# Patient Record
Sex: Male | Born: 1990
Health system: Southern US, Community
[De-identification: ages and names within clinical notes are randomized; demographics above are authoritative.]

## PROBLEM LIST (undated history)

## (undated) DIAGNOSIS — Z789 Other specified health status: Secondary | ICD-10-CM

## (undated) HISTORY — PX: CHEST SURGERY: SHX595

---
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
## 2011-06-30 ENCOUNTER — Emergency Department: Payer: Self-pay | Admitting: Emergency Medicine

## 2011-07-07 ENCOUNTER — Emergency Department: Payer: Self-pay | Admitting: Emergency Medicine

## 2012-02-06 ENCOUNTER — Emergency Department: Payer: Self-pay | Admitting: Emergency Medicine

## 2012-02-06 LAB — URINALYSIS, COMPLETE
Bacteria: NONE SEEN
Bilirubin,UR: NEGATIVE
Blood: NEGATIVE
Glucose,UR: NEGATIVE mg/dL (ref 0–75)
Ketone: NEGATIVE
Leukocyte Esterase: NEGATIVE
Nitrite: NEGATIVE
Ph: 6 (ref 4.5–8.0)
WBC UR: 1 /HPF (ref 0–5)

## 2014-02-18 ENCOUNTER — Ambulatory Visit: Payer: Self-pay | Admitting: Family Medicine

## 2014-04-07 ENCOUNTER — Emergency Department: Payer: Self-pay | Admitting: Emergency Medicine

## 2014-08-03 ENCOUNTER — Emergency Department: Payer: Self-pay | Admitting: Physician Assistant

## 2014-08-05 ENCOUNTER — Emergency Department: Payer: Self-pay | Admitting: Student

## 2014-08-05 LAB — CBC
HCT: 46.3 % (ref 40.0–52.0)
HGB: 15.6 g/dL (ref 13.0–18.0)
MCH: 29.4 pg (ref 26.0–34.0)
MCHC: 33.7 g/dL (ref 32.0–36.0)
MCV: 87 fL (ref 80–100)
Platelet: 221 10*3/uL (ref 150–440)
RBC: 5.32 10*6/uL (ref 4.40–5.90)
RDW: 13.1 % (ref 11.5–14.5)
WBC: 8.3 10*3/uL (ref 3.8–10.6)

## 2014-08-05 LAB — COMPREHENSIVE METABOLIC PANEL
ALBUMIN: 3.9 g/dL (ref 3.4–5.0)
ALT: 33 U/L (ref 14–63)
Alkaline Phosphatase: 61 U/L (ref 46–116)
Anion Gap: 8 (ref 7–16)
BUN: 12 mg/dL (ref 7–18)
Bilirubin,Total: 0.2 mg/dL (ref 0.2–1.0)
CALCIUM: 9.1 mg/dL (ref 8.5–10.1)
Chloride: 102 mmol/L (ref 98–107)
Co2: 31 mmol/L (ref 21–32)
Creatinine: 1.05 mg/dL (ref 0.60–1.30)
EGFR (Non-African Amer.): >60
GLUCOSE: 104 mg/dL — AB (ref 65–99)
Osmolality: 281 (ref 275–301)
POTASSIUM: 3.4 mmol/L — AB (ref 3.5–5.1)
SGOT(AST): 29 U/L (ref 15–37)
Sodium: 141 mmol/L (ref 136–145)
Total Protein: 7.6 g/dL (ref 6.4–8.2)

## 2014-08-05 LAB — URINALYSIS, COMPLETE
Bacteria: NONE SEEN
Bilirubin,UR: NEGATIVE
Blood: NEGATIVE
Glucose,UR: NEGATIVE mg/dL (ref 0–75)
Ketone: NEGATIVE
Leukocyte Esterase: NEGATIVE
NITRITE: NEGATIVE
PH: 6 (ref 4.5–8.0)
PROTEIN: NEGATIVE
Specific Gravity: 1.021 (ref 1.003–1.030)
Squamous Epithelial: <1

## 2014-10-05 ENCOUNTER — Emergency Department: Admit: 2014-10-05 | Disposition: A | Discharge status: Home / Self Care | Payer: Self-pay | Admitting: Internal Medicine

## 2014-10-05 LAB — COMPREHENSIVE METABOLIC PANEL
ALBUMIN: 4.5 g/dL
AST: 27 U/L
Alkaline Phosphatase: 62 U/L
Anion Gap: 8 (ref 7–16)
BILIRUBIN TOTAL: 0.5 mg/dL
BUN: 11 mg/dL
CALCIUM: 9.3 mg/dL
CHLORIDE: 105 mmol/L
Co2: 27 mmol/L
Creatinine: 1.03 mg/dL
EGFR (African American): >60
GLUCOSE: 104 mg/dL — AB
POTASSIUM: 3.5 mmol/L
SGPT (ALT): 23 U/L
SODIUM: 140 mmol/L
Total Protein: 7.6 g/dL

## 2014-10-05 LAB — CBC
HCT: 44.5 % (ref 40.0–52.0)
HGB: 15.4 g/dL (ref 13.0–18.0)
MCH: 29.8 pg (ref 26.0–34.0)
MCHC: 34.6 g/dL (ref 32.0–36.0)
MCV: 86 fL (ref 80–100)
Platelet: 225 10*3/uL (ref 150–440)
RBC: 5.16 10*6/uL (ref 4.40–5.90)
RDW: 13.2 % (ref 11.5–14.5)
WBC: 7.9 10*3/uL (ref 3.8–10.6)

## 2014-10-05 LAB — URINALYSIS, COMPLETE
BLOOD: NEGATIVE
Bacteria: NONE SEEN
Bilirubin,UR: NEGATIVE
Glucose,UR: NEGATIVE mg/dL (ref 0–75)
Ketone: NEGATIVE
LEUKOCYTE ESTERASE: NEGATIVE
NITRITE: NEGATIVE
Ph: 7 (ref 4.5–8.0)
Protein: NEGATIVE
RBC,UR: NONE SEEN /HPF (ref 0–5)
Specific Gravity: 1.018 (ref 1.003–1.030)
Squamous Epithelial: NONE SEEN
WBC UR: <1 /HPF (ref 0–5)

## 2014-10-05 LAB — LIPASE, BLOOD: LIPASE: 51 U/L

## 2014-11-24 ENCOUNTER — Ambulatory Visit
Admission: EM | Admit: 2014-11-24 | Discharge: 2014-11-24 | Disposition: A | Discharge status: Home / Self Care | Payer: BLUE CROSS/BLUE SHIELD | Attending: Family Medicine | Admitting: Family Medicine

## 2014-11-24 DIAGNOSIS — Z87891 Personal history of nicotine dependence: Secondary | ICD-10-CM | POA: Insufficient documentation

## 2014-11-24 DIAGNOSIS — J111 Influenza due to unidentified influenza virus with other respiratory manifestations: Secondary | ICD-10-CM | POA: Diagnosis present

## 2014-11-24 DIAGNOSIS — B349 Viral infection, unspecified: Secondary | ICD-10-CM

## 2014-11-24 HISTORY — DX: Other specified health status: Z78.9

## 2014-11-24 LAB — RAPID STREP SCREEN (MED CTR MEBANE ONLY): Streptococcus, Group A Screen (Direct): NEGATIVE

## 2014-11-24 LAB — RAPID INFLUENZA A&B ANTIGENS (ARMC ONLY)
INFLUENZA A (ARMC): NOT DETECTED
INFLUENZA B (ARMC): NOT DETECTED

## 2014-11-24 MED ORDER — OSELTAMIVIR PHOSPHATE 75 MG PO CAPS: 75.0000 mg | ORAL_CAPSULE | Freq: Two times a day (BID) | ORAL | Status: DC

## 2014-11-24 MED ORDER — GUAIFENESIN-CODEINE 100-10 MG/5ML PO SOLN: 10.0000 mL | Freq: Four times a day (QID) | ORAL | Status: DC | PRN

## 2014-11-24 NOTE — ED Notes (Signed)
Pt c/o H/A, body aches, sore throat, dry/productive cough (yellow/green sputum), and fever up to 102 x 3 days.

## 2014-11-24 NOTE — ED Provider Notes (Signed)
CSN: [id number]     Arrival date & time 11/24/14  1746 History   First MD Initiated Contact with Patient 11/24/14 1916     Chief Complaint  Patient presents with  . Influenza   (Consider location/radiation/quality/duration/timing/severity/associated sxs/prior Treatment) Patient is a 24 y.o. male presenting with flu symptoms.  Influenza Presenting symptoms: cough, fatigue, fever, rhinorrhea and sore throat   Presenting symptoms: no headaches, no myalgias, no nausea, no shortness of breath and no vomiting   Cough:    Cough characteristics:  Productive (mild)   Sputum characteristics:  Clear   Severity:  Mild   Onset quality:  Sudden   Chronicity:  New Fatigue:    Severity:  Mild   Progression:  Unchanged Rhinorrhea:    Quality:  Clear   Timing:  Constant Severity:  Mild Onset quality:  Sudden   Past Medical History  Diagnosis Date  . Patient denies medical problems    Past Surgical History  Procedure Laterality Date  . 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 Scoliosis Mother   . Hearing loss Mother   . Seizures Sister   . Diabetes Sister     type 1   History  Substance Use Topics  . Smoking status: Former Smoker -- 0.50 packs/day for 2 years    Types: Cigarettes    Quit date: 10/25/2014  . Smokeless tobacco: Never Used  . Alcohol Use: Yes     Comment: occasionally    Review of Systems  Constitutional: Positive for fever and fatigue.  HENT: Positive for rhinorrhea and sore throat.   Respiratory: Positive for cough. Negative for shortness of breath.   Gastrointestinal: Negative for nausea and vomiting.  Musculoskeletal: Negative for myalgias.  Neurological: Negative for headaches.    Allergies  Review of patient's allergies indicates no known allergies.  Home Medications   Prior to Admission medications   Medication Sig Start Date End Date Taking? Authorizing Provider  guaiFENesin-codeine 100-10 MG/5ML  syrup Take 10 mLs by mouth every 6 (six) hours as needed for cough. Or qhs prn 11/24/14   Payton Mccallum, MD  oseltamivir (TAMIFLU) 75 MG capsule Take 1 capsule (75 mg total) by mouth 2 (two) times daily. 11/24/14   Payton Mccallum, MD   BP 125/80 mmHg  Pulse 108  Temp(Src) 98.7 F (37.1 C) (Oral)  Resp 16  Ht  (1.803 m)  Wt 206 lb (93.441 kg)  BMI 28.74 kg/m2  SpO2 97% Physical Exam  Constitutional: He appears well-developed and well-nourished. No distress.  HENT:  Head: Normocephalic and atraumatic.  Right Ear: Tympanic membrane, external ear and ear canal normal.  Left Ear: Tympanic membrane, external ear and ear canal normal.  Nose: Mucosal edema and rhinorrhea present.  Mouth/Throat: Uvula is midline, oropharynx is clear and moist and mucous membranes are normal. No oropharyngeal exudate or tonsillar abscesses.  Eyes: Conjunctivae and EOM are normal. Pupils are equal, round, and reactive to light. Right eye exhibits no discharge. Left eye exhibits no discharge. No scleral icterus.  Neck: Normal range of motion. Neck supple. No tracheal deviation present. No thyromegaly present.  Cardiovascular: Normal rate, regular rhythm and normal heart sounds.   Pulmonary/Chest: Effort normal and breath sounds normal. No stridor. No respiratory distress. He has no wheezes. He has no rales. He exhibits no tenderness.  Lymphadenopathy:    He has no cervical adenopathy.  Neurological: He is alert.  Skin: Skin  is warm and dry. No rash noted. He is not diaphoretic.  Nursing note and vitals reviewed.   ED Course  Procedures (including critical care time) Labs Review Labs Reviewed  INFLUENZA A&B ANTIGENS([facility])  RAPID STREP SCREEN  CULTURE, GROUP A STREP [facility])    Imaging Review No results found.   MDM   1. Viral syndrome   (likely flu)  Discharge Medication List as of 11/24/2014  8:13 PM    START taking these medications   Details  guaiFENesin-codeine 100-10 MG/5ML syrup Take 10  mLs by mouth every 6 (six) hours as needed for cough. Or qhs prn, Starting 11/24/2014, Until Discontinued, Print    oseltamivir (TAMIFLU) 75 MG capsule Take 1 capsule (75 mg total) by mouth 2 (two) times daily., Starting 11/24/2014, Until Discontinued, Normal      Plan: 1. Test/x-ray results and diagnosis reviewed with patient 2. rx as per orders; risks, benefits, potential side effects reviewed with patient 3. Recommend supportive treatment with fluids, rest, otc analgesics 4. F/u prn if symptoms worsen or don't improve     Payton Mccallumrlando Alysen Smylie, MD 11/24/14 2014

## 2014-11-27 LAB — CULTURE, GROUP A STREP (THRC)

## 2016-04-14 ENCOUNTER — Emergency Department (HOSPITAL_COMMUNITY): Payer: Worker's Compensation

## 2016-04-14 ENCOUNTER — Encounter (HOSPITAL_COMMUNITY): Payer: Self-pay | Admitting: Emergency Medicine

## 2016-04-14 ENCOUNTER — Emergency Department (HOSPITAL_COMMUNITY)
Admission: EM | Admit: 2016-04-14 | Discharge: 2016-04-14 | Disposition: A | Discharge status: Home / Self Care | Payer: Worker's Compensation | Attending: Emergency Medicine | Admitting: Emergency Medicine

## 2016-04-14 DIAGNOSIS — Z7982 Long term (current) use of aspirin: Secondary | ICD-10-CM | POA: Insufficient documentation

## 2016-04-14 DIAGNOSIS — S0003XA Contusion of scalp, initial encounter: Secondary | ICD-10-CM

## 2016-04-14 DIAGNOSIS — S161XXA Strain of muscle, fascia and tendon at neck level, initial encounter: Secondary | ICD-10-CM | POA: Diagnosis not present

## 2016-04-14 DIAGNOSIS — W228XXA Striking against or struck by other objects, initial encounter: Secondary | ICD-10-CM | POA: Diagnosis not present

## 2016-04-14 DIAGNOSIS — Z87891 Personal history of nicotine dependence: Secondary | ICD-10-CM | POA: Insufficient documentation

## 2016-04-14 DIAGNOSIS — Y990 Civilian activity done for income or pay: Secondary | ICD-10-CM | POA: Diagnosis not present

## 2016-04-14 DIAGNOSIS — Y9289 Other specified places as the place of occurrence of the external cause: Secondary | ICD-10-CM | POA: Diagnosis not present

## 2016-04-14 DIAGNOSIS — Z23 Encounter for immunization: Secondary | ICD-10-CM | POA: Insufficient documentation

## 2016-04-14 DIAGNOSIS — S199XXA Unspecified injury of neck, initial encounter: Secondary | ICD-10-CM | POA: Diagnosis present

## 2016-04-14 DIAGNOSIS — S0990XA Unspecified injury of head, initial encounter: Secondary | ICD-10-CM

## 2016-04-14 DIAGNOSIS — Y9389 Activity, other specified: Secondary | ICD-10-CM | POA: Diagnosis not present

## 2016-04-14 DIAGNOSIS — T148XXA Other injury of unspecified body region, initial encounter: Secondary | ICD-10-CM

## 2016-04-14 MED ORDER — TETANUS-DIPHTH-ACELL PERTUSSIS 5-2.5-18.5 LF-MCG/0.5 IM SUSP
0.5000 mL | Freq: Once | INTRAMUSCULAR | Status: AC
  Administered 2016-04-14: 0.5 mL via INTRAMUSCULAR
  Filled 2016-04-14: qty 0.5

## 2016-04-14 MED ORDER — CYCLOBENZAPRINE HCL 10 MG PO TABS: 10.0000 mg | ORAL_TABLET | Freq: Two times a day (BID) | ORAL | 0 refills | Status: DC | PRN

## 2016-04-14 NOTE — ED Provider Notes (Signed)
[facility]-EMERGENCY DEPT Provider Note   CSN: [id number] Arrival date & time: 04/14/16  1348     History   Chief Complaint Chief Complaint  Patient presents with  . Head Injury    HPI Alvin Castillo is a 25 y.o. male who presents to the ED with a head injury. The patient reports that he was at work and bending over. He stood up quickly and hit his head on a [employer]iler hitch. Patient complains of swollen tender area to the scalp and abrasion to the scalp. He thinks he had brief LOC. He reports nausea but no vomiting.   The history is provided by the patient. No language interpreter was used.  Head Injury   He came to the ER via walk-in. The injury mechanism was a direct blow. He lost consciousness for a period of less than one minute.    Past Medical History:  Diagnosis Date  . Patient denies medical problems     There are no active problems to display for this patient.   Past Surgical History:  Procedure Laterality Date  . TUMOR EXCISION  2010   from chest       Home Medications    Prior to Admission medications   Medication Sig Start Date End Date Taking? Authorizing Provider  acetaminophen (TYLENOL) 325 MG tablet Take 650 mg by mouth every 6 (six) hours as needed for mild pain.   Yes Historical Provider, MD  Aspirin-Caffeine (BAYER BACK & BODY) 500-32.5 MG TABS Take 1 tablet by mouth daily as needed (pain).   Yes Historical Provider, MD  cyclobenzaprine (FLEXERIL) 10 MG tablet Take 1 tablet (10 mg total) by mouth 2 (two) times daily as needed for muscle spasms. 04/14/16   Hope Orlene OchM Neese, NP    Family History Family History  Problem Relation Age of Onset  . Scoliosis Mother   . Hearing loss Mother   . Seizures Sister   . Diabetes Sister     type 1    Social History Social History  Substance Use Topics  . Smoking status: Former Smoker    Packs/day: 0.50    Years: 2.00    Types: Cigarettes    Quit date: 10/25/2014  . Smokeless tobacco: Never Used  .  Alcohol use Yes     Comment: occasionally     Allergies   Review of patient's allergies indicates no known allergies.   Review of Systems Review of Systems   Physical Exam Updated Vital Signs BP 123/88 (BP Location: Right Arm)   Pulse 63   Temp 98 F (36.7 C) (Oral)   Resp 16   Ht 5\' 10"  (1.778 m)   Wt 93.4 kg   SpO2 99%   BMI 29.56 kg/m   Physical Exam  Constitutional: He is oriented to person, place, and time. He appears well-developed and well-nourished. No distress.  HENT:  Head: Head is with contusion.    Right Ear: Tympanic membrane normal.  Left Ear: Tympanic membrane normal.  Nose: Nose normal.  Mouth/Throat: Uvula is midline, oropharynx is clear and moist and mucous membranes are normal.  Contusion and abrasion to scalp  Eyes: Conjunctivae and EOM are normal.  Neck: Neck supple. Spinous process tenderness and muscular tenderness present.  Cardiovascular: Normal rate and regular rhythm.   Pulmonary/Chest: Effort normal. He has no wheezes. He has no rales.  Abdominal: Soft. Bowel sounds are normal. He exhibits no mass. There is no tenderness.  Musculoskeletal: Normal range of motion. He exhibits no  edema.       Cervical back: He exhibits tenderness. He exhibits normal pulse. Decreased range of motion: due to pain.  Radial  pulses strong, adequate circulation, good touch sensation.  Neurological: He is alert and oriented to person, place, and time. He has normal strength. No cranial nerve deficit or sensory deficit. He displays a negative Romberg sign. Gait normal.  Reflex Scores:      Bicep reflexes are 2+ on the right side and 2+ on the left side.      Brachioradialis reflexes are 2+ on the right side and 2+ on the left side.      Patellar reflexes are 2+ on the right side and 2+ on the left side. Skin: Skin is warm and dry.  Psychiatric: He has a normal mood and affect. His behavior is normal.     ED Treatments / Results  Labs (all labs ordered are  listed, but only abnormal results are displayed) Labs Reviewed - No data to display  Radiology Ct Head Wo Contrast  Result Date: 04/14/2016 CLINICAL DATA:  Pt was working under cr and raised up and hit top left side of head on trailer hitch. Pt was knocked out for 15-20 mins. Pt has headache top left of head. Pt also has posterior neck pain. EXAM: CT HEAD WITHOUT CONTRAST CT CERVICAL SPINE WITHOUT CONTRAST TECHNIQUE: Multidetector CT imaging of the head and cervical spine was performed following the standard protocol without intravenous contrast. Multiplanar CT image reconstructions of the cervical spine were also generated. COMPARISON:  None. FINDINGS: CT HEAD FINDINGS Brain: No evidence of acute infarction, hemorrhage, hydrocephalus, extra-axial collection or mass lesion/mass effect. Vascular: No hyperdense vessel or unexpected calcification. Skull: Normal. Negative for fracture or focal lesion. Sinuses/Orbits: No acute finding. Other: None CT CERVICAL SPINE FINDINGS Alignment: Mild reversal of the normal cervical lordosis. Facets are seated. Skull base and vertebrae: No acute fracture. No primary bone lesion or focal pathologic process. Soft tissues and spinal canal: No prevertebral fluid or swelling. No visible canal hematoma. Disc levels: Height maintained throughout without significant degenerative change. Upper chest: Negative. Other:  none IMPRESSION: 1. Negative for bleed or other acute intracranial process. 2. Negative for cervical fracture or other acute bone abnormality. 3. Loss of the normal cervical spine lordosis, which may be secondary to positioning, spasm, or soft tissue injury. Electronically Signed   By: Corlis Leak M.D.   On: 04/14/2016 17:17   Ct Cervical Spine Wo Contrast  Result Date: 04/14/2016 CLINICAL DATA:  Pt was working under cr and raised up and hit top left side of head on trailer hitch. Pt was knocked out for 15-20 mins. Pt has headache top left of head. Pt also has  posterior neck pain. EXAM: CT HEAD WITHOUT CONTRAST CT CERVICAL SPINE WITHOUT CONTRAST TECHNIQUE: Multidetector CT imaging of the head and cervical spine was performed following the standard protocol without intravenous contrast. Multiplanar CT image reconstructions of the cervical spine were also generated. COMPARISON:  None. FINDINGS: CT HEAD FINDINGS Brain: No evidence of acute infarction, hemorrhage, hydrocephalus, extra-axial collection or mass lesion/mass effect. Vascular: No hyperdense vessel or unexpected calcification. Skull: Normal. Negative for fracture or focal lesion. Sinuses/Orbits: No acute finding. Other: None CT CERVICAL SPINE FINDINGS Alignment: Mild reversal of the normal cervical lordosis. Facets are seated. Skull base and vertebrae: No acute fracture. No primary bone lesion or focal pathologic process. Soft tissues and spinal canal: No prevertebral fluid or swelling. No visible canal hematoma. Disc levels: Height maintained  throughout without significant degenerative change. Upper chest: Negative. Other:  none IMPRESSION: 1. Negative for bleed or other acute intracranial process. 2. Negative for cervical fracture or other acute bone abnormality. 3. Loss of the normal cervical spine lordosis, which may be secondary to positioning, spasm, or soft tissue injury. Electronically Signed   By: Corlis Leak M.D.   On: 04/14/2016 17:17    Procedures Procedures (including critical care time)  Medications Ordered in ED Medications  Tdap (BOOSTRIX) injection 0.5 mL (not administered)     Initial Impression / Assessment and Plan / ED Course  I have reviewed the triage vital signs and the nursing notes.  Pertinent  imaging results that were available during my care of the patient were reviewed by me and considered in my medical decision making (see chart for details).  Clinical Course    Final Clinical Impressions(s) / ED Diagnoses  25 y.o. male with head injury and neck pain stable for d/c  without abnormal findings on CT, patient ambulatory at time of d/c without dizziness, n/v and steady gait. Discussed with the patient clinical and CT findings and plan of care and all questioned fully answered. He will return if any problems arise. Head injury precautions for the family given. Final diagnoses:  Minor head injury, initial encounter  Contusion of scalp, initial encounter  Hematoma  Cervical strain, acute, initial encounter    New Prescriptions New Prescriptions   CYCLOBENZAPRINE (FLEXERIL) 10 MG TABLET    Take 1 tablet (10 mg total) by mouth 2 (two) times daily as needed for muscle spasms.     [place], NP 04/14/16 1736    Nira Conn, MD 04/15/16 0900

## 2016-04-14 NOTE — ED Triage Notes (Signed)
Pt stood up "really fast" and struck his head on a trailer hitch and has a hematoma to top of head. Pt states he remembers hitting his head but doesn't remember if he had a loc or not. Friend states when he found him he was laying on his back awake and able to answer questions. Pt is alert and 0x4. PERRLA, pt states he feels nauseous.

## 2016-04-14 NOTE — Discharge Instructions (Signed)
Take tylenol or ibuprofen as needed for pain. Apply ice to the injured areas. Return for any problems.

## 2016-04-14 NOTE — ED Notes (Signed)
Pt st's he hit his head on a trailer hitch at work.  St's he thinks he had LOC.  Pt alert and oriented at this time

## 2017-05-07 IMAGING — CT CT CERVICAL SPINE W/O CM
4 of 8 series · 14 of 33 positions shown, 15 images · non-contrast
Comparison: None.

CLINICAL DATA: Pt was working under cr and raised up and hit top
left side of head on trailer hitch. Pt was knocked [REDACTED]
mins. Pt has headache top left of head. Pt also has posterior neck
pain.

EXAM:
CT HEAD WITHOUT CONTRAST
CT CERVICAL SPINE WITHOUT CONTRAST
TECHNIQUE: Multidetector CT imaging of the head and cervical spine was
performed following the standard protocol without intravenous
contrast. Multiplanar CT image reconstructions of the cervical spine
were also generated.

[Series 203: coronal st, idose (1) · coronal · 0.40mm/px · 3 of 76 slices shown]
[im 19/76  bone]
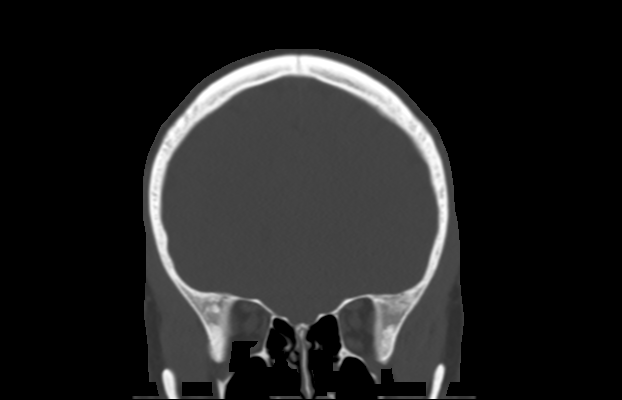
[im 38/76  bone]
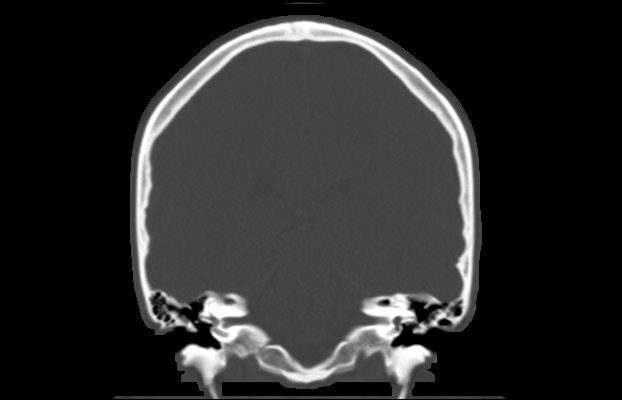
[im 57/76  bone]
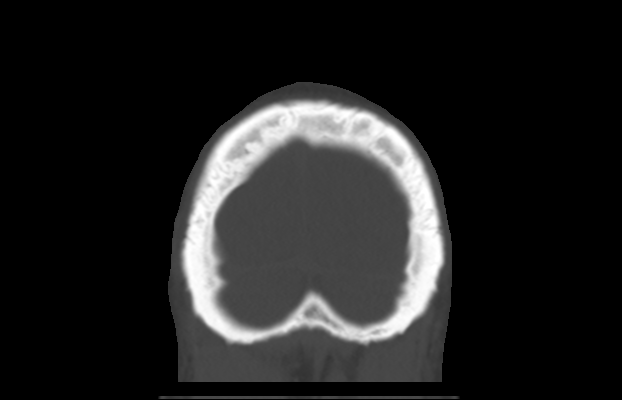

[Series 302: soft tissue, idose (2) · axial · 0.39mm/px · z∈[+85,+197]mm · 3 of 114 slices shown]
[im 29/114  soft-tissue]
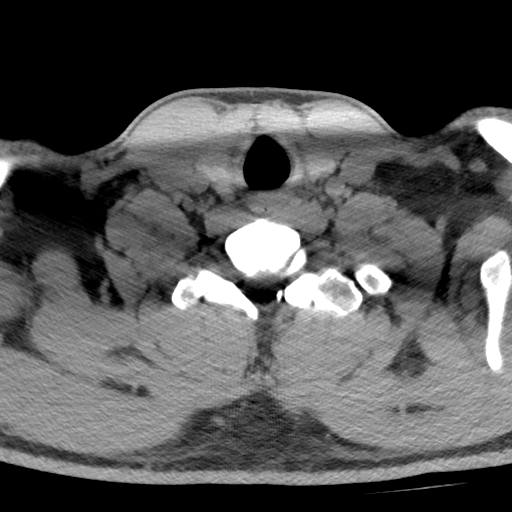
[im 57/114  soft-tissue]
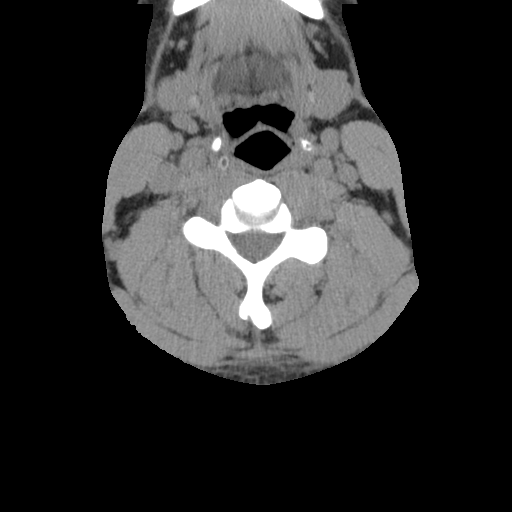
[im 85/114  soft-tissue]
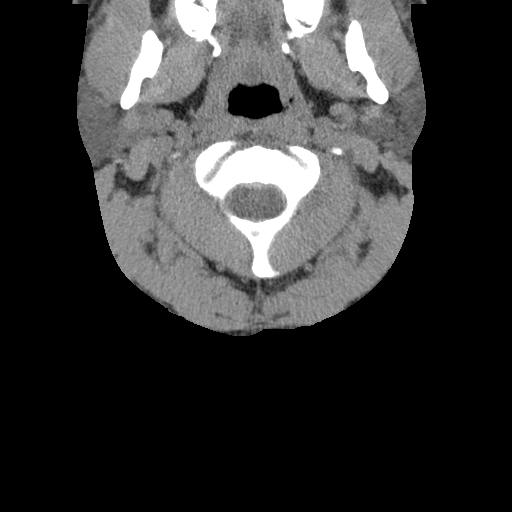

[Series 305: sagittal, idose (2) · sagittal · 0.34mm/px · 5 of 100 slices shown]
[im 17/100  bone]
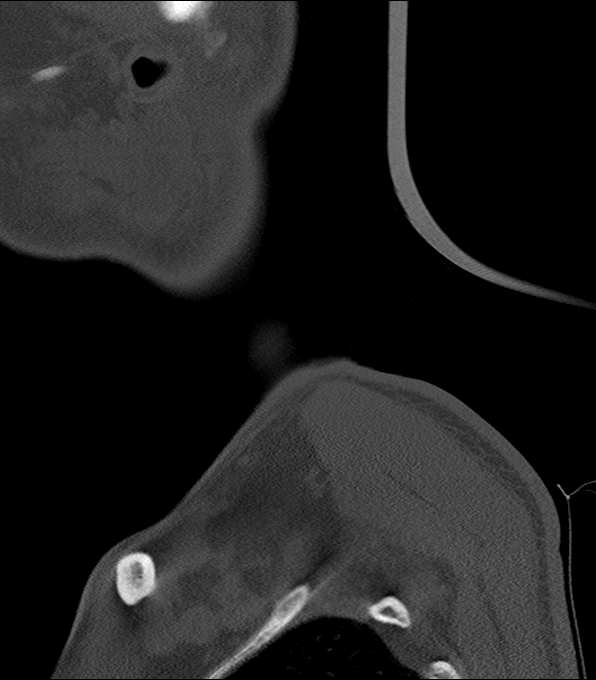
[im 34/100  bone]
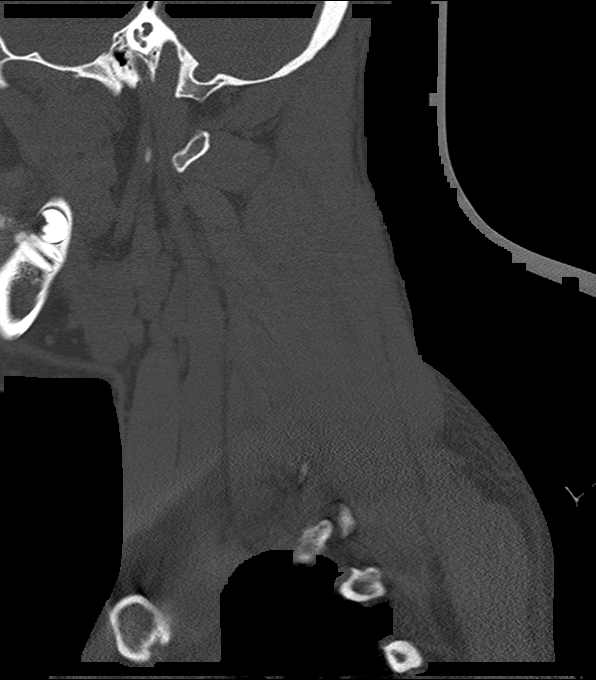
[im 50/100  bone]
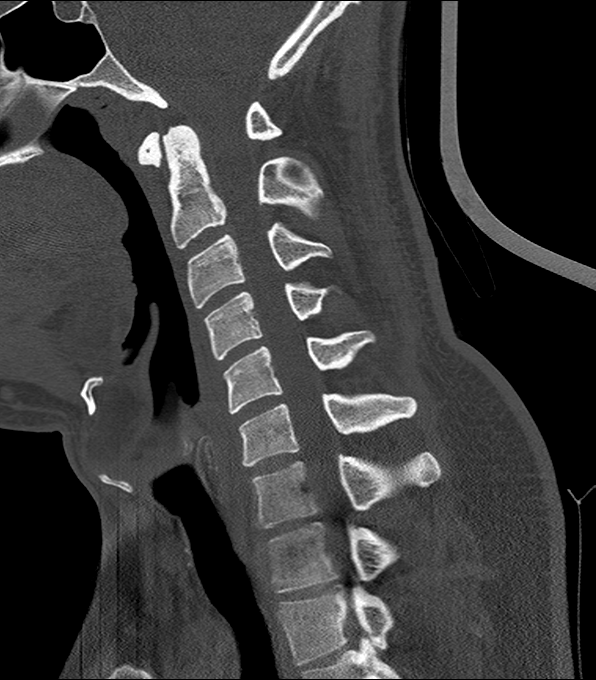
[im 67/100  bone]
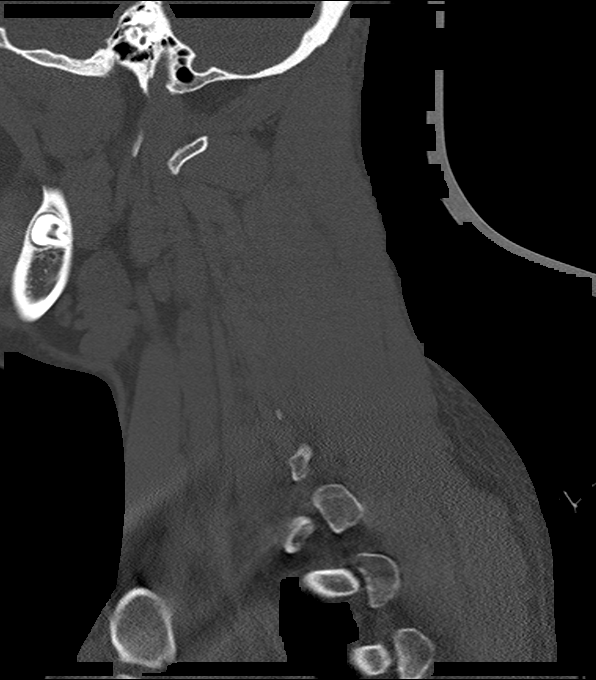
[im 83/100  bone]
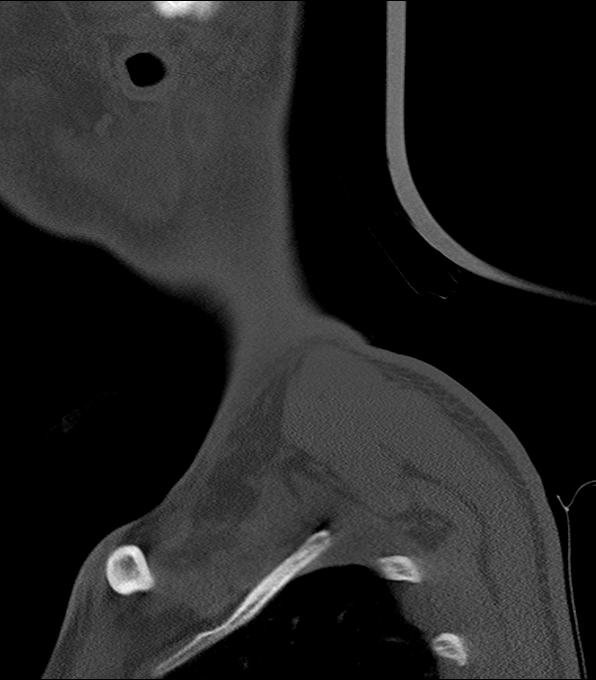

[Series 306: orthogonals, idose (2) · axial · 0.53mm/px · z∈[+52,+156]mm · 3 of 114 slices shown, 4 images]
[im 29/114  soft-tissue]
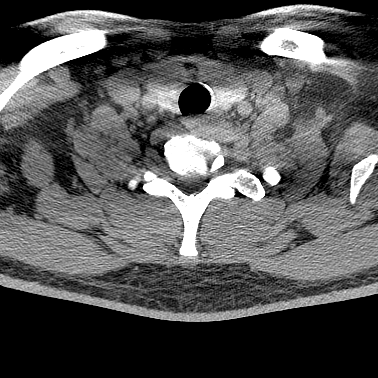
[im 29/114  bone]
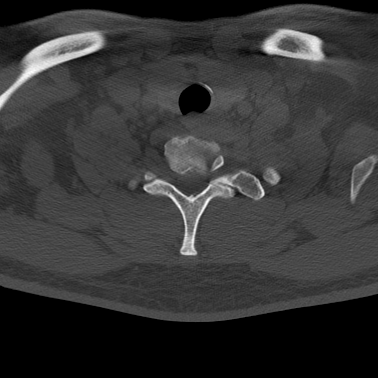
[im 57/114  bone]
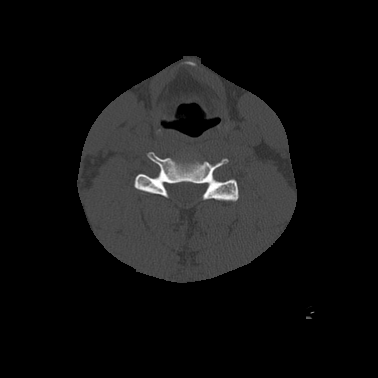
[im 85/114  bone]
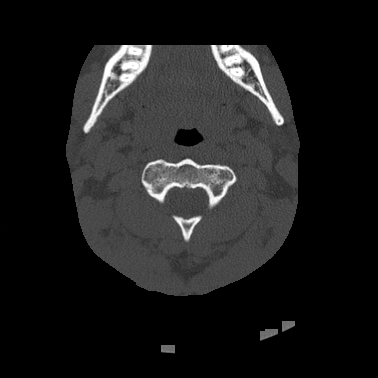

[14 of 33 positions shown; findings below may reference images not displayed]

FINDINGS: CT HEAD FINDINGS

Brain: No evidence of acute infarction, hemorrhage, hydrocephalus,
extra-axial collection or mass lesion/mass effect.

Vascular: No hyperdense vessel or unexpected calcification.

Skull: Normal. Negative for fracture or focal lesion.

Sinuses/Orbits: No acute finding.

Other: None

CT CERVICAL SPINE FINDINGS

Alignment: Mild reversal of the normal cervical lordosis. Facets are
seated.

Skull base and vertebrae: No acute fracture. No primary bone lesion
or focal pathologic process.

Soft tissues and spinal canal: No prevertebral fluid or swelling. No
visible canal hematoma.

Disc levels: Height maintained throughout without significant
degenerative change.

Upper chest: Negative.

Other:  none
IMPRESSION: 1. Negative for bleed or other acute intracranial process.
2. Negative for cervical fracture or other acute bone abnormality.
3. Loss of the normal cervical spine lordosis, which may be
secondary to positioning, spasm, or soft tissue injury.

## 2018-02-01 ENCOUNTER — Emergency Department (HOSPITAL_BASED_OUTPATIENT_CLINIC_OR_DEPARTMENT_OTHER)
Admission: EM | Admit: 2018-02-01 | Discharge: 2018-02-01 | Disposition: A | Discharge status: Home / Self Care | Payer: Self-pay | Attending: Emergency Medicine | Admitting: Emergency Medicine

## 2018-02-01 ENCOUNTER — Encounter (HOSPITAL_BASED_OUTPATIENT_CLINIC_OR_DEPARTMENT_OTHER): Payer: Self-pay | Admitting: Emergency Medicine

## 2018-02-01 ENCOUNTER — Other Ambulatory Visit: Payer: Self-pay

## 2018-02-01 DIAGNOSIS — Y999 Unspecified external cause status: Secondary | ICD-10-CM | POA: Insufficient documentation

## 2018-02-01 DIAGNOSIS — Z87891 Personal history of nicotine dependence: Secondary | ICD-10-CM | POA: Insufficient documentation

## 2018-02-01 DIAGNOSIS — Y929 Unspecified place or not applicable: Secondary | ICD-10-CM | POA: Insufficient documentation

## 2018-02-01 DIAGNOSIS — Z79899 Other long term (current) drug therapy: Secondary | ICD-10-CM | POA: Insufficient documentation

## 2018-02-01 DIAGNOSIS — Y939 Activity, unspecified: Secondary | ICD-10-CM | POA: Insufficient documentation

## 2018-02-01 DIAGNOSIS — X58XXXA Exposure to other specified factors, initial encounter: Secondary | ICD-10-CM | POA: Insufficient documentation

## 2018-02-01 DIAGNOSIS — S39012A Strain of muscle, fascia and tendon of lower back, initial encounter: Secondary | ICD-10-CM | POA: Insufficient documentation

## 2018-02-01 LAB — URINALYSIS, ROUTINE W REFLEX MICROSCOPIC
Bilirubin Urine: NEGATIVE
Glucose, UA: NEGATIVE mg/dL
Hgb urine dipstick: NEGATIVE
Ketones, ur: NEGATIVE mg/dL
Leukocytes, UA: NEGATIVE
Nitrite: NEGATIVE
Protein, ur: NEGATIVE mg/dL
SPECIFIC GRAVITY, URINE: 1.025 (ref 1.005–1.030)
pH: 6 (ref 5.0–8.0)

## 2018-02-01 MED ORDER — IBUPROFEN 800 MG PO TABS: 800.0000 mg | ORAL_TABLET | Freq: Three times a day (TID) | ORAL | 0 refills | Status: DC | PRN

## 2018-02-01 MED ORDER — CYCLOBENZAPRINE HCL 10 MG PO TABS: 10.0000 mg | ORAL_TABLET | Freq: Two times a day (BID) | ORAL | 0 refills | Status: DC | PRN

## 2018-02-01 MED ORDER — KETOROLAC TROMETHAMINE 60 MG/2ML IM SOLN
60.0000 mg | Freq: Once | INTRAMUSCULAR | Status: AC
  Administered 2018-02-01: 60 mg via INTRAMUSCULAR
  Filled 2018-02-01: qty 2

## 2018-02-01 MED FILL — IBUPROFEN 800 MG TAB: 800 | 7 days supply | Qty: 21 | Fill #0

## 2018-02-01 MED FILL — CYCLOBENZAPRINE HCL 10 MG T: 10 | 5 days supply | Qty: 10 | Fill #0

## 2018-02-01 NOTE — ED Provider Notes (Signed)
[facility] EMERGENCY DEPARTMENT Provider Note   CSN: [id number] Arrival date & time: 02/01/18  6962     History   Chief Complaint Chief Complaint  Patient presents with  . Flank Pain    leg pain    HPI Alvin Castillo is a 27 y.o. male.  The history is provided by the patient. No language interpreter was used.  Flank Pain    Alvin Castillo is a 27 y.o. male who presents to the Emergency Department complaining of flank pain. He presents to the emergency department for evaluation of left flank pain that began three days ago. Pain is constant nature and feels like there is a circle of spasms in his back. It is worse with bending as well as movement. He denies any fevers, chest pain, abdominal pain, nausea, vomiting, dysuria. He does note that there is a different odor to his urine recently. He has no medical problems and takes no medications. He also reports a year of right leg pain.  Denies IVDA.   Past Medical History:  Diagnosis Date  . Patient denies medical problems     There are no active problems to display for this patient.   Past Surgical History:  Procedure Laterality Date  . 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 Authorizing Provider  acetaminophen (TYLENOL) 325 MG tablet Take 650 mg by mouth every 6 (six) hours as needed for mild pain.   Yes [provider]  Aspirin-Caffeine (BAYER BACK & BODY) 500-32.5 MG TABS Take 1 tablet by mouth daily as needed (pain).    [provider]  cyclobenzaprine (FLEXERIL) 10 MG tablet Take 1 tablet (10 mg total) by mouth 2 (two) times daily as needed for muscle spasms. 02/01/18   Tilden Fossa, MD  ibuprofen (ADVIL,MOTRIN) 800 MG tablet Take 1 tablet (800 mg total) by mouth every 8 (eight) hours as needed. 02/01/18   Tilden Fossa, MD    Family History Family History  Problem Relation Age of  Onset  . Scoliosis Mother   . Hearing loss Mother   . Seizures Sister   . Diabetes Sister        type 1    Social History Social History   Tobacco Use  . Smoking status: Former Smoker    Packs/day: 0.50    Years: 2.00    Pack years: 1.00    Types: Cigarettes    Last attempt to quit: 10/25/2014    Years since quitting: 3.2  . Smokeless tobacco: Never Used  Substance Use Topics  . Alcohol use: Yes    Comment: occasionally  . Drug use: No     Allergies   Patient has no known allergies.   Review of Systems Review of Systems  Genitourinary: Positive for flank pain.  All other systems reviewed and are negative.    Physical Exam Updated Vital Signs BP 128/77 (BP Location: Right Arm)   Pulse 72   Temp 98.2 F (36.8 C) (Oral)   Resp 18   Ht 6' (1.829 m)   Wt 96.2 kg (212 lb)   SpO2 99%   BMI 28.75 kg/m   Physical Exam  Constitutional: He is oriented to person, place, and time. He appears well-developed and well-nourished.  HENT:  Head: Normocephalic and atraumatic.  Cardiovascular: Normal rate and regular rhythm.  No murmur  heard. Pulmonary/Chest: Effort normal and breath sounds normal. No respiratory distress.  Abdominal: Soft. There is no tenderness. There is no rebound and no guarding.  Musculoskeletal: He exhibits no edema or tenderness.  No CVA tenderness. No midline thoracic or lumbar tenderness  Neurological: He is alert and oriented to person, place, and time.  Five out of five strength and bilateral lower extremities with sensation to light touch intact in bilateral lower extremities  Skin: Skin is warm and dry.  Psychiatric: He has a normal mood and affect. His behavior is normal.  Nursing note and vitals reviewed.    ED Treatments / Results  Labs (all labs ordered are listed, but only abnormal results are displayed) Labs Reviewed  URINALYSIS, ROUTINE W REFLEX MICROSCOPIC    EKG None  Radiology No results found.  Procedures Procedures  (including critical care time)  Medications Ordered in ED Medications  ketorolac (TORADOL) injection 60 mg (60 mg Intramuscular Given 02/01/18 0952)     Initial Impression / Assessment and Plan / ED Course  I have reviewed the triage vital signs and the nursing notes.  Pertinent labs & imaging results that were available during my care of the patient were reviewed by me and considered in my medical decision making (see chart for details).     Patient here for evaluation of several days of left flank pain that is worse with movement. He is non-toxic appearing on examination and neurologically intact. Presentation is not consistent with renal colic, UTI, cauda equina. Discussed with patient home care for musculoskeletal back pain. Discussed outpatient follow-up as well as return precautions.  Final Clinical Impressions(s) / ED Diagnoses   Final diagnoses:  Strain of lumbar region, initial encounter    ED Discharge Orders        Ordered    cyclobenzaprine (FLEXERIL) 10 MG tablet  2 times daily PRN     02/01/18 1013    ibuprofen (ADVIL,MOTRIN) 800 MG tablet  Every 8 hours PRN     02/01/18 1013       Tilden Fossaees, Haruki Arnold, MD 02/01/18 1529

## 2018-02-01 NOTE — ED Triage Notes (Signed)
Right leg pain x several months and 3 days of left flank pain

## 2018-12-04 ENCOUNTER — Other Ambulatory Visit: Payer: Self-pay

## 2018-12-04 ENCOUNTER — Emergency Department: Admission: EM | Admit: 2018-12-04 | Discharge: 2018-12-04 | Discharge status: Absent without leave | Payer: Self-pay

## 2018-12-25 ENCOUNTER — Emergency Department
Admission: EM | Admit: 2018-12-25 | Discharge: 2018-12-25 | Disposition: A | Discharge status: Home / Self Care | Payer: Self-pay | Attending: Emergency Medicine | Admitting: Emergency Medicine

## 2018-12-25 ENCOUNTER — Other Ambulatory Visit: Payer: Self-pay

## 2018-12-25 ENCOUNTER — Encounter: Payer: Self-pay | Admitting: Emergency Medicine

## 2018-12-25 DIAGNOSIS — Z87891 Personal history of nicotine dependence: Secondary | ICD-10-CM | POA: Insufficient documentation

## 2018-12-25 DIAGNOSIS — M5431 Sciatica, right side: Secondary | ICD-10-CM | POA: Insufficient documentation

## 2018-12-25 DIAGNOSIS — M79671 Pain in right foot: Secondary | ICD-10-CM | POA: Insufficient documentation

## 2018-12-25 MED ORDER — PREDNISONE 20 MG PO TABS
60.0000 mg | ORAL_TABLET | Freq: Once | ORAL | Status: AC
  Administered 2018-12-25: 60 mg via ORAL
  Filled 2018-12-25: qty 3

## 2018-12-25 MED ORDER — PREDNISONE 50 MG PO TABS: ORAL_TABLET | ORAL | 0 refills | Status: DC

## 2018-12-25 MED ORDER — LIDOCAINE 5 % EX PTCH: 1.0000 | MEDICATED_PATCH | CUTANEOUS | 0 refills | Status: DC

## 2018-12-25 MED ORDER — KETOROLAC TROMETHAMINE 30 MG/ML IJ SOLN
30.0000 mg | Freq: Once | INTRAMUSCULAR | Status: AC
  Administered 2018-12-25: 30 mg via INTRAMUSCULAR
  Filled 2018-12-25: qty 1

## 2018-12-25 MED ORDER — CYCLOBENZAPRINE HCL 5 MG PO TABS: ORAL_TABLET | ORAL | 0 refills | Status: DC

## 2018-12-25 MED ORDER — IBUPROFEN 600 MG PO TABS: 600.0000 mg | ORAL_TABLET | Freq: Four times a day (QID) | ORAL | 0 refills | Status: DC | PRN

## 2018-12-25 NOTE — Discharge Instructions (Signed)
Your exam is consistent with sciatica.  Please begin ibuprofen before you go to bed, Flexeril when you get home, steroids tomorrow.  Place Lidoderm patch on area of your back that is most sore.  Use heat to back tonight.  Please follow-up with Dr. Sharlet Salina for your back and Dr. Vickki Muff for your foot.

## 2018-12-25 NOTE — ED Notes (Signed)
See triage note  Presents with lower back pain which is moving into right leg  Also now having intermittent pain to foot  Describes as burning

## 2018-12-25 NOTE — ED Triage Notes (Signed)
Says low back painrad down right leg for about a month. Hurts every day.  Also says has had numbness in foot when he stands for long time.  Also says achiles pain.

## 2018-12-25 NOTE — ED Provider Notes (Signed)
[facility] Emergency Department Provider Note  ____________________________________________  Time seen: Approximately 3:08 PM  I have reviewed the triage vital signs and the nursing notes.   HISTORY  Chief Complaint Back Pain and Leg Pain    HPI Alvin Castillo is a 28 y.o. male that presents to the emergency department for evaluation of right-sided back pain that radiates into right leg for 1 month.  Patient states that foot will also occasionally burn and he will feel a popping sensation to his midfoot.  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  He has taken Alvin Castillo occasionally for symptoms.  No bowel or bladder dysfunction or saddle anesthesias.  He did fall 2 days ago but this did not seem to aggravate pain or change pain.   Past Medical History:  Diagnosis Date  . Patient denies medical problems     There are no active problems to display for this patient.   Past Surgical History:  Procedure Laterality Date  . 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 Authorizing Provider  cyclobenzaprine (FLEXERIL) 5 MG tablet Take 1-2 tablets 3 times daily as needed 12/25/18   Laban Emperor, PA-C  ibuprofen (ADVIL) 600 MG tablet Take 1 tablet (600 mg total) by mouth every 6 (six) hours as needed. 12/25/18   Laban Emperor, PA-C  lidocaine (LIDODERM) 5 % Place 1 patch onto the skin daily. Remove & Discard patch within 12 hours or as directed by MD 12/25/18   Laban Emperor, PA-C  predniSONE (DELTASONE) 50 MG tablet Take 1 tablet per day 12/25/18   Laban Emperor, PA-C    Allergies Patient has no known allergies.  Family History  Problem Relation Age of Onset  . Scoliosis Mother   . Hearing loss Mother   . Seizures Sister   . Diabetes Sister        type 1    Social History Social History   Tobacco Use  . Smoking status:  Former Smoker    Packs/day: 0.50    Years: 2.00    Pack years: 1.00    Types: Cigarettes    Quit date: 10/25/2014    Years since quitting: 4.1  . Smokeless tobacco: Never Used  Substance Use Topics  . Alcohol use: Yes    Comment: occasionally  . Drug use: No     Review of Systems  Constitutional: No fever/chills Cardiovascular: No chest pain. Respiratory: No SOB. Gastrointestinal: No abdominal pain.  No nausea, no vomiting.  Musculoskeletal: Positive for back pain. Skin: Negative for rash, abrasions, lacerations, ecchymosis.   ____________________________________________   PHYSICAL EXAM:  VITAL SIGNS: ED Triage Vitals  Enc Vitals Group     BP 12/25/18 1500 128/70     Pulse Rate 12/25/18 1500 78     Resp 12/25/18 1500 18     Temp 12/25/18 1500 98 F (36.7 C)     Temp Source 12/25/18 1500 Oral     SpO2 12/25/18 1500 98 %     Weight 12/25/18 1331 235 lb (106.6 kg)     Height 12/25/18 1331 6' (1.829 m)     Head Circumference --      Peak Flow --      Pain Score 12/25/18 1331 8     Pain Loc --      Pain Edu? --  Excl. in GC? --      Constitutional: Alert and oriented. Well appearing and in no acute distress. Eyes: Conjunctivae are normal. PERRL. EOMI. Head: Atraumatic. ENT:      Ears:      Nose: No congestion/rhinnorhea.      Mouth/Throat: Mucous membranes are moist.  Neck: No stridor. Cardiovascular: Normal rate, regular rhythm.  Good peripheral circulation. Respiratory: Normal respiratory effort without tachypnea or retractions. Lungs CTAB. Good air entry to the bases with no decreased or absent breath sounds. Musculoskeletal: Full range of motion to all extremities. No gross deformities appreciated.  No pinpoint tenderness to palpation to lumbar spine.  Mild tenderness to palpation over right lumbar paraspinal muscles.  Strength equal in lower extremities bilaterally.  Able to move all toes.  Normal gait. Neurologic:  Normal speech and language. No gross  focal neurologic deficits are appreciated.  Skin:  Skin is warm, dry and intact. No rash noted. Psychiatric: Mood and affect are normal. Speech and behavior are normal. Patient exhibits appropriate insight and judgement.   ____________________________________________   LABS (all labs ordered are listed, but only abnormal results are displayed)  Labs Reviewed - No data to display ____________________________________________  EKG   ____________________________________________  RADIOLOGY  No results found.  ____________________________________________    PROCEDURES  Procedure(s) performed:    Procedures    Medications  ketorolac (TORADOL) 30 MG/ML injection 30 mg (30 mg Intramuscular Given 12/25/18 1530)  predniSONE (DELTASONE) tablet 60 mg (60 mg Oral Given 12/25/18 1529)     ____________________________________________   INITIAL IMPRESSION / ASSESSMENT AND PLAN / ED COURSE  Pertinent labs & imaging results that were available during my care of the patient were reviewed by me and considered in my medical decision making (see chart for details).  Review of the [place] CSRS was performed in accordance of the [facility] prior to dispensing any controlled drugs.   Patient's diagnosis is consistent with sciatica.  Vital signs and exam are reassuring.  Back pain with radiculopathy consistent with sciatica, and patient will follow up with ortho for this.  Patient will follow-up with podiatry for the popping sensation that he feels in his mid foot.  No trauma, no indication for imaging at this time.  Patient will be discharged home with prescriptions for prednisone, ibuprofen, flexeril, lidocaine. Patient is to follow up with PCP, Ortho, podiatry as directed. Patient is given ED precautions to return to the ED for any worsening or new symptoms.  Alvin Castillo was evaluated in Emergency Department on 12/25/2018 for the symptoms described in the history of present illness. He was  evaluated in the context of the global COVID-19 pandemic, which necessitated consideration that the patient might be at risk for infection with the SARS-CoV-2 virus that causes COVID-19. Institutional protocols and algorithms that pertain to the evaluation of patients at risk for COVID-19 are in a state of rapid change based on information released by regulatory bodies including the CDC and federal and state organizations. These policies and algorithms were followed during the patient's care in the ED.     ____________________________________________  FINAL CLINICAL IMPRESSION(S) / ED DIAGNOSES  Final diagnoses:  Sciatica of right side  Right foot pain      NEW MEDICATIONS STARTED DURING THIS VISIT:  ED Discharge Orders         Ordered    ibuprofen (ADVIL) 600 MG tablet  Every 6 hours PRN     12/25/18 1544    cyclobenzaprine (FLEXERIL) 5 MG tablet  12/25/18 1544    predniSONE (DELTASONE) 50 MG tablet     12/25/18 1544    lidocaine (LIDODERM) 5 %  Every 24 hours     12/25/18 1544              This chart was dictated using voice recognition software/Dragon. Despite best efforts to proofread, errors can occur which can change the meaning. Any change was purely unintentional.    Enid DerryWagner, Zohar Laing, PA-C 12/25/18 2252    Sharman CheekStafford, Phillip, MD 12/26/18 2326

## 2019-06-12 ENCOUNTER — Encounter (HOSPITAL_COMMUNITY): Payer: Self-pay

## 2019-06-12 ENCOUNTER — Emergency Department (HOSPITAL_COMMUNITY)
Admission: EM | Admit: 2019-06-12 | Discharge: 2019-06-12 | Disposition: A | Discharge status: Home / Self Care | Payer: Self-pay | Attending: Emergency Medicine | Admitting: Emergency Medicine

## 2019-06-12 DIAGNOSIS — Z87891 Personal history of nicotine dependence: Secondary | ICD-10-CM | POA: Insufficient documentation

## 2019-06-12 DIAGNOSIS — T782XXA Anaphylactic shock, unspecified, initial encounter: Secondary | ICD-10-CM | POA: Insufficient documentation

## 2019-06-12 MED ORDER — EPINEPHRINE 0.3 MG/0.3ML IJ SOAJ: INTRAMUSCULAR | 1 refills | Status: AC

## 2019-06-12 MED ORDER — FAMOTIDINE IN NACL 20-0.9 MG/50ML-% IV SOLN
20.0000 mg | Freq: Once | INTRAVENOUS | Status: AC
Start: 2019-06-12 — End: 2019-06-12
  Administered 2019-06-12: 20 mg via INTRAVENOUS
  Filled 2019-06-12: qty 50

## 2019-06-12 MED ORDER — PREDNISONE 10 MG (21) PO TBPK: ORAL_TABLET | Freq: Every day | ORAL | 0 refills | Status: DC

## 2019-06-12 MED ORDER — METHYLPREDNISOLONE SODIUM SUCC 125 MG IJ SOLR
125.0000 mg | Freq: Once | INTRAMUSCULAR | Status: AC
Start: 2019-06-12 — End: 2019-06-12
  Administered 2019-06-12: 125 mg via INTRAVENOUS
  Filled 2019-06-12: qty 2

## 2019-06-12 NOTE — ED Triage Notes (Signed)
Pt presents with c/o allergic reaction. Per EMS, pt has had some intermittent issues for the past 2 years breaking out in hives at work and it is usually resolved with Benadryl. Pt reports this happened today, he took the 50mg  Benadryl but the hives were getting worse, covering his body head to toe. Pt also reported some shortness of breath this time. EMS was called and pt given .3mg  Epi en route IM. Pt reports shortness of breath relieved after EPI. Placed on O2 by EMS, 98% on the O2. Hives alleviated at this time, no itching noted at this time by patient.

## 2019-06-12 NOTE — ED Provider Notes (Signed)
[facility]-EMERGENCY DEPT Provider Note   CSN: [id number] Arrival date & time: 06/12/19  1441     History   Chief Complaint Chief Complaint  Patient presents with  . Allergic Reaction    HPI Alvin Castillo is a 28 y.o. male with no significant past medical history presents today for evaluation of allergic reaction.  He reports that intermittently over the past 2 years at work he has had times when he gets hives.  These actions have been gradually worsening.  He states that he took 50 mg of Benadryl however the hives continued to worsen covering his body from head to toe.  He reported some shortness of breath, feeling like his throat was closing, and nausea without vomiting.  He denies any known new exposures.  He has never required IM epi before.  He was treated with 0.3 mg IM epi in route by EMS after which he reported feeling much better.  He reports that he still feels like he has trace chest tightness however that is normal for him after even a mild allergic reaction and is not concerning to him.  He denies any itching currently.    HPI  Past Medical History:  Diagnosis Date  . Patient denies medical problems     There are no active problems to display for this patient.   Past Surgical History:  Procedure Laterality Date  . 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 Authorizing Provider  diphenhydrAMINE (BENADRYL) 50 MG tablet Take 50 mg by mouth every 6 (six) hours as needed for itching or allergies.   Yes [provider]  cyclobenzaprine (FLEXERIL) 5 MG tablet Take 1-2 tablets 3 times daily as needed Patient not taking: Reported on 06/12/2019 12/25/18   Enid Derry, PA-C  EPINEPHrine 0.3 mg/0.3 mL IJ SOAJ injection Inject one device into the side of the thigh for severe allergic reactions.  May repeat with second device in 5-10 minutes  if needed.  After using call 9-11. 06/12/19   Cristina Gong, PA-C  ibuprofen (ADVIL) 600 MG tablet Take 1 tablet (600 mg total) by mouth every 6 (six) hours as needed. Patient not taking: Reported on 06/12/2019 12/25/18   Enid Derry, PA-C  lidocaine (LIDODERM) 5 % Place 1 patch onto the skin daily. Remove & Discard patch within 12 hours or as directed by MD Patient not taking: Reported on 06/12/2019 12/25/18   Enid Derry, PA-C  predniSONE (STERAPRED UNI-PAK 21 TAB) 10 MG (21) TBPK tablet Take by mouth daily. Take 6 tabs by mouth daily  for 1 day, then 5 tabs for 1 day, then 4 tabs for 1 day, then 3 tabs for 1 day, 2 tabs for 1 day, then 1 tab by mouth daily for 1 day 06/12/19   Cristina Gong, PA-C    Family History Family History  Problem Relation Age of Onset  . Scoliosis Mother   . Hearing loss Mother   . Seizures Sister   . Diabetes Sister        type 1    Social History Social History   Tobacco Use  . Smoking status: Former Smoker    Packs/day: 0.50    Years: 2.00    Pack years: 1.00    Types: Cigarettes    Quit date: 10/25/2014    Years since quitting:  4.6  . Smokeless tobacco: Never Used  Substance Use Topics  . Alcohol use: Yes    Comment: occasionally  . Drug use: No     Allergies   Patient has no known allergies.   Review of Systems Review of Systems  Constitutional: Negative for chills and fever.  HENT:       Feeling of throat closing, resolved  Respiratory: Positive for chest tightness and shortness of breath (Resolved). Negative for cough.   Cardiovascular: Negative for chest pain.  Gastrointestinal: Positive for nausea (Resolved). Negative for abdominal pain.  Musculoskeletal: Negative for back pain and neck pain.  Skin: Positive for rash (Resolved).  Neurological: Negative for weakness and headaches.  All other systems reviewed and are negative.    Physical Exam Updated Vital Signs BP 135/86   Pulse 71   Temp 98.8 F (37.1 C)  (Oral)   Resp 18   Ht 5' 11.5" (1.816 m)   Wt 98.4 kg   SpO2 99%   BMI 29.84 kg/m   Physical Exam Vitals signs and nursing note reviewed.  Constitutional:      General: He is not in acute distress.    Appearance: He is well-developed. He is not diaphoretic.  HENT:     Head: Normocephalic and atraumatic.  Eyes:     General: No scleral icterus.       Right eye: No discharge.        Left eye: No discharge.     Conjunctiva/sclera: Conjunctivae normal.  Neck:     Musculoskeletal: Normal range of motion.  Cardiovascular:     Rate and Rhythm: Normal rate and regular rhythm.     Heart sounds: Normal heart sounds.  Pulmonary:     Effort: Pulmonary effort is normal. No respiratory distress.     Breath sounds: Normal breath sounds. No stridor.  Abdominal:     General: There is no distension.  Musculoskeletal:        General: No deformity.  Skin:    General: Skin is warm and dry.     Findings: No rash.  Neurological:     General: No focal deficit present.     Mental Status: He is alert and oriented to person, place, and time.     Motor: No abnormal muscle tone.  Psychiatric:        Behavior: Behavior normal.      ED Treatments / Results  Labs (all labs ordered are listed, but only abnormal results are displayed) Labs Reviewed - No data to display  EKG None  Radiology No results found.  Procedures Procedures (including critical care time)  Medications Ordered in ED Medications  methylPREDNISolone sodium succinate (SOLU-MEDROL) 125 mg/2 mL injection 125 mg (125 mg Intravenous Given 06/12/19 1524)  famotidine (PEPCID) IVPB 20 mg premix (0 mg Intravenous Stopped 06/12/19 1903)     Initial Impression / Assessment and Plan / ED Course  I have reviewed the triage vital signs and the nursing notes.  Pertinent labs & imaging results that were available during my care of the patient were reviewed by me and considered in my medical decision making (see chart for details).        Patient presents today for evaluation of allergic reaction with to unknown substance.  He has had worsening allergic reactions consisting of hives over the past 2 years when at work.  Today he began having allergic reaction which did not respond to 50 mg p.o. Benadryl.  This progressed to include shortness  of breath, feeling like his throat was closing and nausea.  He was treated with IM epi by EMS.  Triage notes do not state that he had steroids or Pepcid.  IV Solu-Medrol and IV Pepcid were ordered.  On initial evaluation patient reports he has minimal chest tightness however generally feels very much improved.  Plan to monitor patient in the emergency room.  Patient was monitored for 4 hours without evidence of recurrent allergic reaction.  He felt well and requested to go home.  He is given prescription for continued p.o. steroids given reaction to unknown allergen, along with EpiPen.  He is instructed on how and when to safely use EpiPen's.  Return precautions were discussed with patient who states their understanding.  At the time of discharge patient denied any unaddressed complaints or concerns.  Patient is agreeable for discharge home.   Final Clinical Impressions(s) / ED Diagnoses   Final diagnoses:  Anaphylaxis, initial encounter    ED Discharge Orders         Ordered    Ambulatory referral to Allergy     06/12/19 1832    EPINEPHrine 0.3 mg/0.3 mL IJ SOAJ injection     06/12/19 1850    predniSONE (STERAPRED UNI-PAK 21 TAB) 10 MG (21) TBPK tablet  Daily     06/12/19 1850           Ollen Gross 06/12/19 2107    Lacretia Leigh, MD 06/13/19 [phone]

## 2019-06-12 NOTE — ED Notes (Signed)
PA at bedside.

## 2019-06-12 NOTE — Discharge Instructions (Addendum)
Today you were seen and evaluated for an allergic reaction.      If you develop any concerning symptoms, especially feeling like your throat is closing, shortness of breath, swelling of the face, lips or tongue then please return to the emergency room immediately for evaluation.    For the next 24 hours please take 1 pill (25 mg) of benadryl every 6 hours.  If you have mild symptoms such as itching, mild nausea you may take a second benadryl.    You have also been given a prescription for 4 days of steroids.  Please take your first dose about 24 hours after you were given steroids in the emergency room.  Steroids may give you the feeling of being hot, increased appetite, and extra energy.    I have given you a prescription for two epi pens.  Please make sure you keep one with you at all times.  Please consider keeping two pills of benadryl with your epi pen and if you use it take the benadryl and call 9-11.  Please do not store epi-pens in your car or allow them to get very hot or cold as this can make them not work as well.

## 2019-06-12 NOTE — ED Provider Notes (Signed)
Medical screening examination/treatment/procedure(s) were conducted as a shared visit with non-physician practitioner(s) and myself.  I personally evaluated the patient during the encounter.    Patient presents after having allergic reaction prior to arrival.  Was treated with epinephrine.  Feels much better at this time.  His initial rash is now disappeared.  Will prescribe same as well as give referral to allergist.   Lacretia Leigh, MD 06/12/19 1550

## 2019-09-16 ENCOUNTER — Encounter: Payer: Self-pay | Admitting: Emergency Medicine

## 2019-09-16 ENCOUNTER — Other Ambulatory Visit: Payer: Self-pay

## 2019-09-16 ENCOUNTER — Emergency Department
Admission: EM | Admit: 2019-09-16 | Discharge: 2019-09-16 | Disposition: A | Discharge status: Home / Self Care | Payer: Self-pay | Attending: Emergency Medicine | Admitting: Emergency Medicine

## 2019-09-16 ENCOUNTER — Emergency Department: Payer: Self-pay

## 2019-09-16 DIAGNOSIS — M79671 Pain in right foot: Secondary | ICD-10-CM | POA: Insufficient documentation

## 2019-09-16 DIAGNOSIS — Z79899 Other long term (current) drug therapy: Secondary | ICD-10-CM | POA: Insufficient documentation

## 2019-09-16 DIAGNOSIS — M5136 Other intervertebral disc degeneration, lumbar region: Secondary | ICD-10-CM | POA: Insufficient documentation

## 2019-09-16 DIAGNOSIS — Z87891 Personal history of nicotine dependence: Secondary | ICD-10-CM | POA: Insufficient documentation

## 2019-09-16 DIAGNOSIS — M5137 Other intervertebral disc degeneration, lumbosacral region: Secondary | ICD-10-CM

## 2019-09-16 MED ORDER — NAPROXEN 500 MG PO TABS: 500.0000 mg | ORAL_TABLET | Freq: Two times a day (BID) | ORAL | 0 refills | Status: DC

## 2019-09-16 NOTE — ED Triage Notes (Signed)
Says pain in right foot when he stands on it.

## 2019-09-16 NOTE — ED Notes (Signed)
See triage note  Presents with lower back pain  States back pain is across the lower back  Then developed pain to right foot  Describes pain as "hot poker' when putting pressure

## 2019-09-16 NOTE — Discharge Instructions (Signed)
Follow-up with your primary care provider or [facility] acute care if any continued problems. Begin taking naproxen 5 mg twice daily with food.  You may use ice or heat as needed to help with your discomfort.  If not improving you may also consider following up with an orthopedist.  Wear supportive shoes when at work.

## 2019-09-16 NOTE — ED Provider Notes (Signed)
[facility] Emergency Department Provider Note   ____________________________________________   First MD Initiated Contact with Patient 09/16/19 1124     (approximate)  I have reviewed the triage vital signs and the nursing notes.   HISTORY  Chief Complaint Back Pain and Foot Pain   HPI Alvin Castillo is a 29 y.o. male presents to the ED with complaint of low back pain that started several days ago without incident of injury.  Patient states it feels like "hot poker" especially when he is standing or walking on the floors at his work.  Patient later developed pain in his right foot but denies any radiation from his back to his foot.  He denies any previous back injury or foot injury.  He rates his pain as 6/10.       Past Medical History:  Diagnosis Date  . Patient denies medical problems     There are no problems to display for this patient.   Past Surgical History:  Procedure Laterality Date  . CHEST SURGERY     had mass removed from chest.  . 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 Authorizing Provider  diphenhydrAMINE (BENADRYL) 50 MG tablet Take 50 mg by mouth every 6 (six) hours as needed for itching or allergies.    [provider]  EPINEPHrine 0.3 mg/0.3 mL IJ SOAJ injection Inject one device into the side of the thigh for severe allergic reactions.  May repeat with second device in 5-10 minutes if needed.  After using call 9-11. 06/12/19   Cristina Gong, PA-C  naproxen (NAPROSYN) 500 MG tablet Take 1 tablet (500 mg total) by mouth 2 (two) times daily with a meal. 09/16/19   Tommi Rumps, PA-C    Allergies Patient has no known allergies.  Family History  Problem Relation Age of Onset  . Scoliosis Mother   . Hearing loss Mother   . Seizures Sister   . Diabetes Sister        type 1    Social History Social History   Tobacco Use  .  Smoking status: Former Smoker    Packs/day: 0.50    Years: 2.00    Pack years: 1.00    Types: Cigarettes    Quit date: 10/25/2014    Years since quitting: 4.8  . Smokeless tobacco: Never Used  Substance Use Topics  . Alcohol use: Yes    Comment: occasionally  . Drug use: No    Review of Systems Constitutional: No fever/chills Cardiovascular: Denies chest pain. Respiratory: Denies shortness of breath. Gastrointestinal: No abdominal pain.  No nausea, no vomiting.  Genitourinary: Negative for dysuria. Musculoskeletal: Positive for low back and right foot pain. Skin: Negative for rash. Neurological: Negative for headaches, focal weakness or numbness. ____________________________________________   PHYSICAL EXAM:  VITAL SIGNS: ED Triage Vitals [09/16/19 1059]  Enc Vitals Group     BP      Pulse      Resp      Temp      Temp src      SpO2      Weight 218 lb (98.9 kg)     Height 6' (1.829 m)     Head Circumference      Peak Flow      Pain Score 6     Pain Loc      Pain Edu?  Excl. in [place]?    Constitutional: Alert and oriented. Well appearing and in no acute distress. Eyes: Conjunctivae are normal.  Head: Atraumatic. Neck: No stridor.   Cardiovascular: Normal rate, regular rhythm. Grossly normal heart sounds.  Good peripheral circulation. Respiratory: Normal respiratory effort.  No retractions. Lungs CTAB. Musculoskeletal: No gross deformities noted on examination of the back however there is some tenderness on palpation of the lumbar spine.  No step-offs were noted.  There is some paravertebral tenderness noted.  No muscle spasms were noted with range of motion.  Good muscle strength bilaterally.  On examination of the right foot there is no gross deformity.  There is tenderness on palpation of the posterior calcaneus but to palpate and isolate the Achilles tendon no pain was elicited.  Patient is able to flex and extend without any difficulty.  Motor sensory function  intact and capillary refill is less than 3 seconds.  Skin is intact and no discoloration was noted. Neurologic:  Normal speech and language. No gross focal neurologic deficits are appreciated. No gait instability. Skin:  Skin is warm, dry and intact. No rash noted. Psychiatric: Mood and affect are normal. Speech and behavior are normal.  ____________________________________________   LABS (all labs ordered are listed, but only abnormal results are displayed)  Labs Reviewed - No data to display  RADIOLOGY   Official radiology report(s): DG Lumbar Spine 2-3 Views  Result Date: 09/16/2019 CLINICAL DATA:  Low back pain EXAM: LUMBAR SPINE - 2-3 VIEW COMPARISON:  None. FINDINGS: Frontal, lateral, and spot lumbosacral lateral images were obtained. There are 6 non-rib-bearing lumbar type vertebral bodies. There is no fracture or spondylolisthesis. There is moderately severe narrowing at L6-S1. There is minimal disc space narrowing at L5-6. Other disc spaces appear unremarkable. No erosive change. Small Schmorl's nodes are noted superiorly at multiple levels in the lower thoracic and lumbar regions. IMPRESSION: Moderately severe disc space narrowing at L6-S1. Mild disc space narrowing at L5-6. Disc spaces otherwise appear normal. No fracture or spondylolisthesis. Electronically Signed   By: Lowella Grip III M.D.   On: 09/16/2019 12:16   DG Foot Complete Right  Result Date: 09/16/2019 CLINICAL DATA:  Pain EXAM: RIGHT FOOT COMPLETE - 3+ VIEW COMPARISON:  None. FINDINGS: Frontal, oblique, and lateral views were obtained. There is no fracture or dislocation. Joint spaces appear normal. No erosive change. IMPRESSION: No fracture or dislocation.  No evident arthropathy. Electronically Signed   By: Lowella Grip III M.D.   On: 09/16/2019 12:15    ____________________________________________   PROCEDURES  Procedure(s) performed (including Critical  Care):  Procedures   ____________________________________________   INITIAL IMPRESSION / ASSESSMENT AND PLAN / ED COURSE  As part of my medical decision making, I reviewed the following data within the electronic MEDICAL RECORD NUMBER Notes from prior ED visits and [place] Controlled Substance Database  29 year old male presents to the ED with complaint of right foot and low back pain that did not start at the same time but is causing discomfort today causing him to leave work.  Patient states that his foot is worse when he is standing or walking on it.  He also states his back pain increases with walking or standing especially at work.  Physical exam is low suspicion for a bony abnormality.  Lumbar spine imaging did show degenerative changes especially L5-S1 area.  No calcaneal spurs were noted on his foot x-ray.  Patient was encouraged to use supportive shoes when he is walking and we discussed  his back issues.  Patient will start on naproxen 500 mg twice daily with food.  He is to follow-up with his PCP or [facility] if any continued problems. ____________________________________________   FINAL CLINICAL IMPRESSION(S) / ED DIAGNOSES  Final diagnoses:  Degenerative disc disease at L5-S1 level     ED Discharge Orders         Ordered    naproxen (NAPROSYN) 500 MG tablet  2 times daily with meals     09/16/19 1300           Note:  This document was prepared using Dragon voice recognition software and may include unintentional dictation errors.    Tommi Rumps, PA-C 09/16/19 1427    Emily Filbert, MD 09/16/19 1434

## 2019-09-16 NOTE — ED Triage Notes (Signed)
Back pain and feels like hot poker in foot.

## 2020-10-08 IMAGING — CR DG FOOT COMPLETE 3+V*R*
1 series · 3 of 3 positions shown · non-contrast
Comparison: None.

CLINICAL DATA: Pain

EXAM:
RIGHT FOOT COMPLETE - 3+ VIEW

[Series 1: dg foot complete right · 0.14mm/px · 3 of 3 slices shown]
[im 1/3]
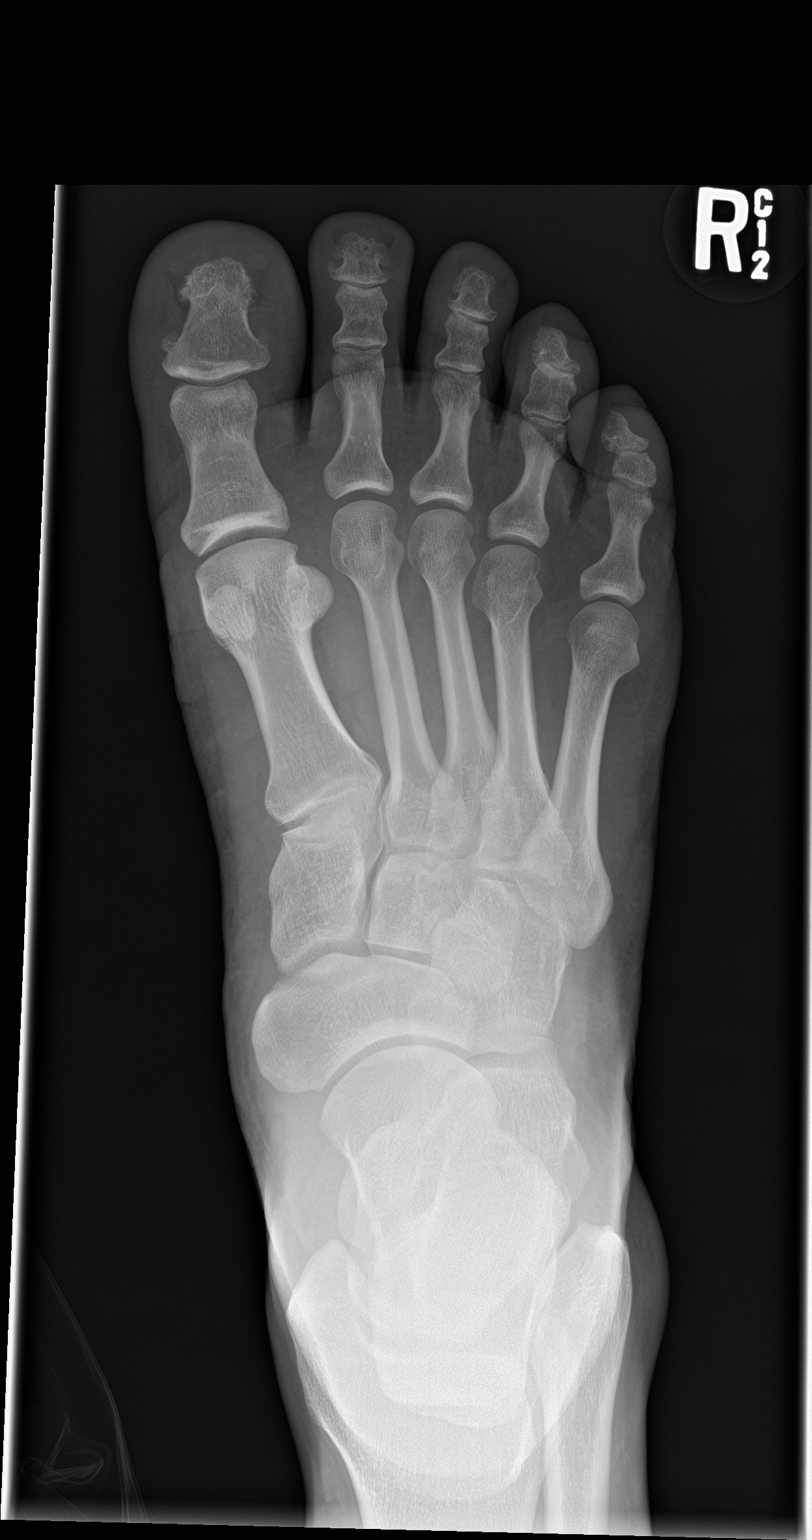
[im 2/3]
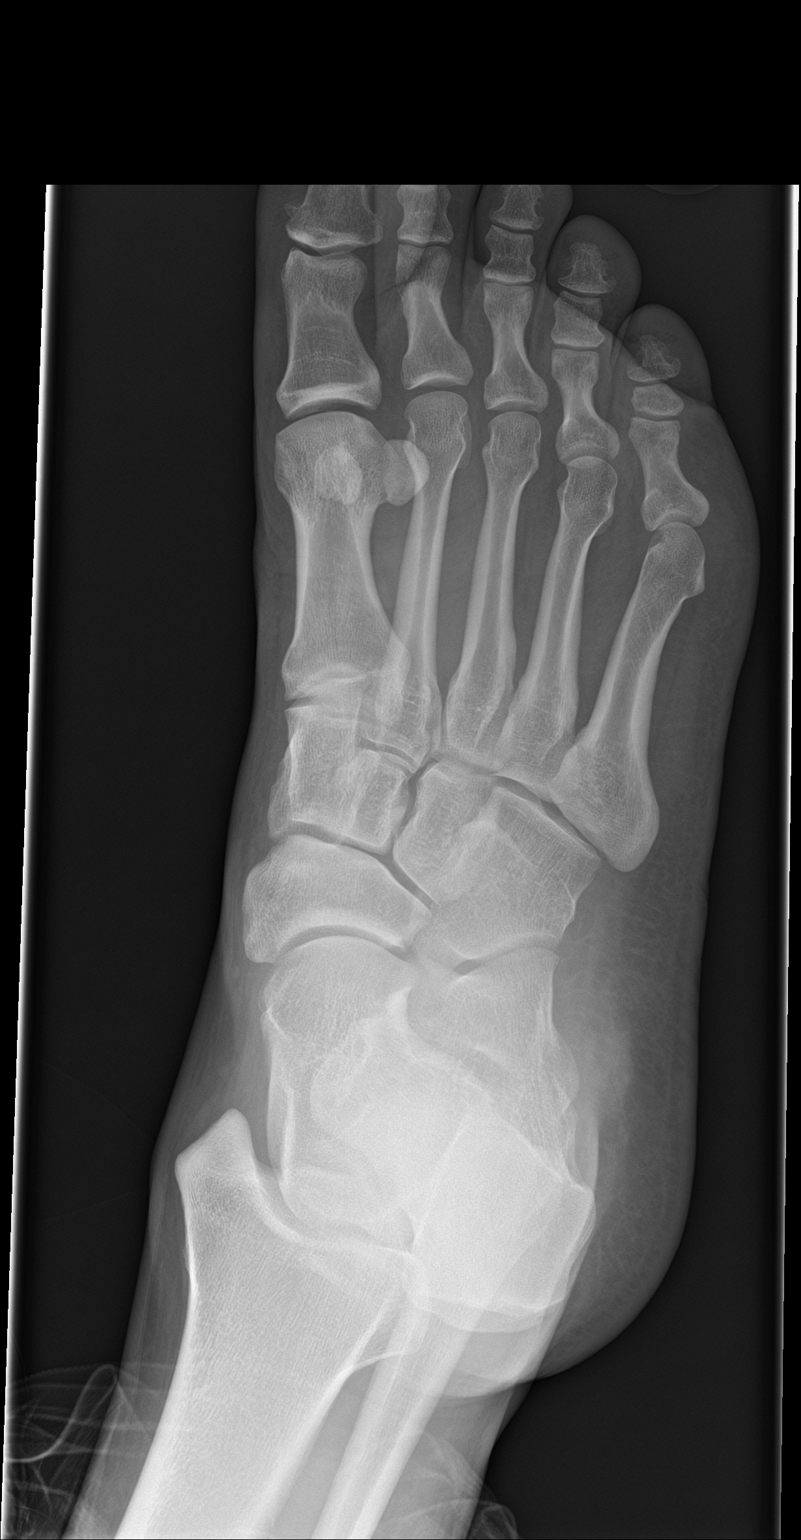
[im 3/3]
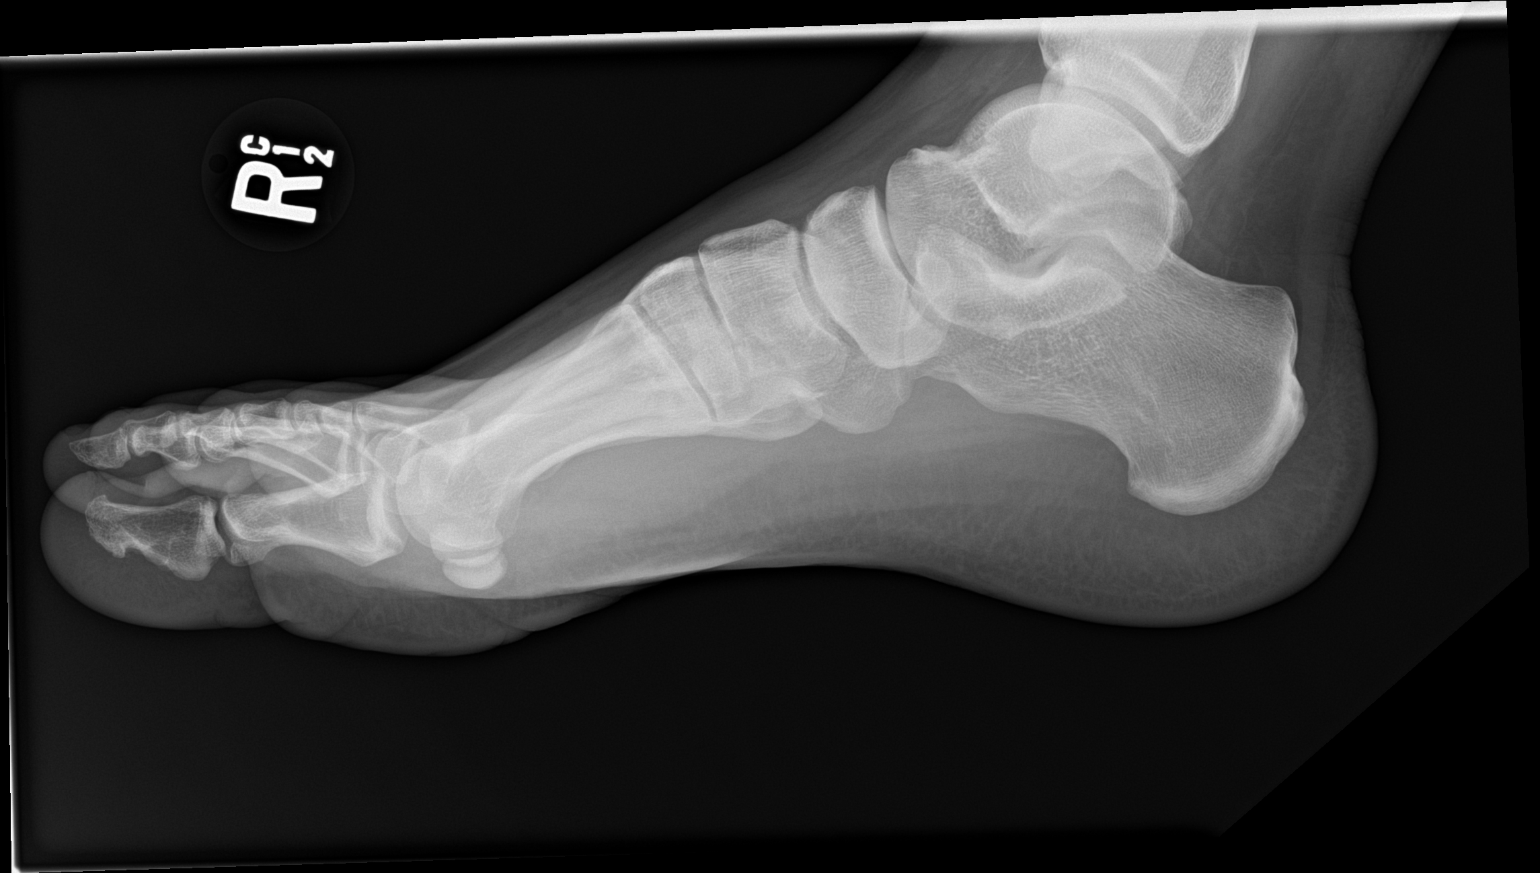

[3 of 3 positions shown; findings below may reference images not displayed]

FINDINGS: Frontal, oblique, and lateral views were obtained. There is no
fracture or dislocation. Joint spaces appear normal. No erosive
change.
IMPRESSION: No fracture or dislocation.  No evident arthropathy.

## 2022-02-25 ENCOUNTER — Ambulatory Visit (INDEPENDENT_AMBULATORY_CARE_PROVIDER_SITE_OTHER): Payer: Commercial Managed Care - PPO

## 2022-02-25 ENCOUNTER — Ambulatory Visit
Admission: RE | Admit: 2022-02-25 | Discharge: 2022-02-25 | Disposition: A | Discharge status: Home / Self Care | Payer: Commercial Managed Care - PPO | Source: Ambulatory Visit | Attending: Emergency Medicine | Admitting: Emergency Medicine

## 2022-02-25 VITALS — BP 136/93 | HR 70 | Temp 98.2°F | Resp 15 | Ht 72.0 in | Wt 235.0 lb

## 2022-02-25 DIAGNOSIS — R1032 Left lower quadrant pain: Secondary | ICD-10-CM | POA: Diagnosis not present

## 2022-02-25 DIAGNOSIS — R109 Unspecified abdominal pain: Secondary | ICD-10-CM

## 2022-02-25 LAB — COMPREHENSIVE METABOLIC PANEL
ALT: 24 U/L (ref 0–44)
AST: 21 U/L (ref 15–41)
Albumin: 4.3 g/dL (ref 3.5–5.0)
Alkaline Phosphatase: 64 U/L (ref 38–126)
Anion gap: 5 (ref 5–15)
BUN: 15 mg/dL (ref 6–20)
CO2: 28 mmol/L (ref 22–32)
Calcium: 9.1 mg/dL (ref 8.9–10.3)
Chloride: 104 mmol/L (ref 98–111)
Creatinine, Ser: 1.06 mg/dL (ref 0.61–1.24)
GFR, Estimated: >60 mL/min (ref >60)
Glucose, Bld: 95 mg/dL (ref 70–99)
Potassium: 3.8 mmol/L (ref 3.5–5.1)
Sodium: 137 mmol/L (ref 135–145)
Total Bilirubin: 0.6 mg/dL (ref 0.3–1.2)
Total Protein: 8.1 g/dL (ref 6.5–8.1)

## 2022-02-25 LAB — URINALYSIS, ROUTINE W REFLEX MICROSCOPIC
Bilirubin Urine: NEGATIVE
Glucose, UA: NEGATIVE mg/dL
Hgb urine dipstick: NEGATIVE
Ketones, ur: NEGATIVE mg/dL
Leukocytes,Ua: NEGATIVE
Nitrite: NEGATIVE
Protein, ur: NEGATIVE mg/dL
Specific Gravity, Urine: 1.025 (ref 1.005–1.030)
pH: 6 (ref 5.0–8.0)

## 2022-02-25 LAB — CBC WITH DIFFERENTIAL/PLATELET
Abs Immature Granulocytes: 0.02 10*3/uL (ref 0.00–0.07)
Basophils Absolute: 0 10*3/uL (ref 0.0–0.1)
Basophils Relative: 1 %
Eosinophils Absolute: 0.2 10*3/uL (ref 0.0–0.5)
Eosinophils Relative: 2 %
HCT: 45.9 % (ref 39.0–52.0)
Hemoglobin: 15.9 g/dL (ref 13.0–17.0)
Immature Granulocytes: 0 %
Lymphocytes Relative: 28 %
Lymphs Abs: 2.4 10*3/uL (ref 0.7–4.0)
MCH: 29.3 pg (ref 26.0–34.0)
MCHC: 34.6 g/dL (ref 30.0–36.0)
MCV: 84.5 fL (ref 80.0–100.0)
Monocytes Absolute: 0.8 10*3/uL (ref 0.1–1.0)
Monocytes Relative: 9 %
Neutro Abs: 5.1 10*3/uL (ref 1.7–7.7)
Neutrophils Relative %: 60 %
Platelets: 285 10*3/uL (ref 150–400)
RBC: 5.43 MIL/uL (ref 4.22–5.81)
RDW: 12.8 % (ref 11.5–15.5)
WBC: 8.5 10*3/uL (ref 4.0–10.5)
nRBC: 0 % (ref 0.0–0.2)

## 2022-02-25 LAB — OCCULT BLOOD X 1 CARD TO LAB, STOOL: Fecal Occult Bld: NEGATIVE

## 2022-02-25 LAB — LIPASE, BLOOD: Lipase: 34 U/L (ref 11–51)

## 2022-02-25 NOTE — Discharge Instructions (Addendum)
As we discussed, your blood work is all very reassuring and so is your abdominal x-ray but I do not have a good idea of why you are having the pain you are having.  I do feel you need a CT scan of your abdomen.  Please go to the emergency department at [facility] for further evaluation and imaging.

## 2022-02-25 NOTE — ED Notes (Signed)
Patient is being discharged from the Urgent Care and sent to the [facility] Emergency Department via private vehicle . Per Becky Augusta, NP, patient is in need of higher level of care due to needing CT of abdomen. Patient is aware and verbalizes understanding of plan of care.  Vitals:   02/25/22 1558  BP: (!) 136/93  Pulse: 70  Resp: 15  Temp: 98.2 F (36.8 C)  SpO2: 99%

## 2022-02-25 NOTE — ED Triage Notes (Signed)
Patient c/o left sided back pain that radiates to the left upper abdomen for the past 3 weeks.  Patient reports nausea.  Patient denies V/D.  Patient denies fevers. Patient denies dysuria or hematuria.

## 2022-02-25 NOTE — ED Provider Notes (Signed)
[facility]    CSN: [id number] Arrival date & time: 02/25/22  1535      History   Chief Complaint Chief Complaint  Patient presents with   Abdominal Pain   Back Pain    HPI Alvin Castillo is a 31 y.o. male.   HPI  31 year old male here for evaluation of GI complaints.  Patient reports that for the last 3 weeks he has had constant left-sided back pain that will radiate around to the left side of his abdomen.  This pain is worse when he is sitting still and he does not notice as much when he is moving.  He states it never gets less than 4/10 and will be a maximum of 6/10.  This is associated with nausea.  Patient also reports that he has been having blood with every bowel movement.  He does have a history of constipation but denies any straining.  He does endorse that his stools will start off hard and then get softer.  He never has any diarrhea.  Over the last year he has had periods where 1 side of his stool is bright red and then he will start dripping blood into the toilet.  He is not experiencing that at present.  He does drink but he does no longer smoke.  He has not had any fever or vomiting.  He denies any urinary complaints or blood in his urine.  No diarrhea.  Patient is unaware of family medical history so he is unaware of any history of colon cancer, Crohn's, or ulcerative colitis.  Patient has on occasion had rectal itching but he does not have any at present.  Patient also denies night sweats or weight loss.  Past Medical History:  Diagnosis Date   Patient denies medical problems     There are no problems to display for this patient.   Past Surgical History:  Procedure Laterality Date   CHEST SURGERY     had mass removed from chest.   TUMOR EXCISION  2010   from chest       Home Medications    Prior to Admission medications   Medication Sig Start Date End Date Taking? Authorizing Provider  EPINEPHrine 0.3 mg/0.3 mL IJ SOAJ injection Inject  one device into the side of the thigh for severe allergic reactions.  May repeat with second device in 5-10 minutes if needed.  After using call 9-11. 06/12/19   Cristina Gong, PA-C    Family History Family History  Problem Relation Age of Onset   Scoliosis Mother    Hearing loss Mother    Seizures Sister    Diabetes Sister        type 1    Social History Social History   Tobacco Use   Smoking status: Former    Packs/day: 0.50    Years: 2.00    Total pack years: 1.00    Types: Cigarettes    Quit date: 10/25/2014    Years since quitting: 7.3   Smokeless tobacco: Never  Vaping Use   Vaping Use: Never used  Substance Use Topics   Alcohol use: Yes    Comment: occasionally   Drug use: No     Allergies   Patient has no known allergies.   Review of Systems Review of Systems  Constitutional:  Negative for diaphoresis, fever and unexpected weight change.  Gastrointestinal:  Positive for abdominal pain, anal bleeding, blood in stool, constipation, nausea and rectal pain. Negative  for diarrhea and vomiting.  Genitourinary:  Negative for dysuria, frequency, hematuria and urgency.  Musculoskeletal:  Positive for back pain.  Skin:  Negative for rash.  Hematological: Negative.   Psychiatric/Behavioral: Negative.       Physical Exam Triage Vital Signs ED Triage Vitals [02/25/22 1556]  Enc Vitals Group     BP      Pulse      Resp      Temp      Temp src      SpO2      Weight 235 lb (106.6 kg)     Height 6' (1.829 m)     Head Circumference      Peak Flow      Pain Score 4     Pain Loc      Pain Edu?      Excl. in [place]?    No data found.  Updated Vital Signs BP (!) 136/93 (BP Location: Left Arm)   Pulse 70   Temp 98.2 F (36.8 C) (Oral)   Resp 15   Ht 6' (1.829 m)   Wt 235 lb (106.6 kg)   SpO2 99%   BMI 31.87 kg/m   Visual Acuity Right Eye Distance:   Left Eye Distance:   Bilateral Distance:    Right Eye Near:   Left Eye Near:    Bilateral  Near:     Physical Exam Vitals and nursing note reviewed.  Constitutional:      Appearance: Normal appearance. He is not ill-appearing.  HENT:     Head: Normocephalic and atraumatic.  Cardiovascular:     Rate and Rhythm: Normal rate and regular rhythm.     Pulses: Normal pulses.     Heart sounds: Normal heart sounds. No murmur heard.    No friction rub. No gallop.  Pulmonary:     Effort: Pulmonary effort is normal.     Breath sounds: Normal breath sounds. No wheezing, rhonchi or rales.  Abdominal:     General: Abdomen is flat. Bowel sounds are normal.     Palpations: Abdomen is soft.     Tenderness: There is abdominal tenderness. There is no right CVA tenderness, left CVA tenderness, guarding or rebound.  Skin:    General: Skin is warm and dry.     Capillary Refill: Capillary refill takes less than 2 seconds.     Findings: No erythema or rash.  Neurological:     General: No focal deficit present.     Mental Status: He is alert and oriented to person, place, and time.  Psychiatric:        Mood and Affect: Mood normal.        Behavior: Behavior normal.        Thought Content: Thought content normal.        Judgment: Judgment normal.      UC Treatments / Results  Labs (all labs ordered are listed, but only abnormal results are displayed) Labs Reviewed  URINALYSIS, ROUTINE W REFLEX MICROSCOPIC  CBC WITH DIFFERENTIAL/PLATELET  COMPREHENSIVE METABOLIC PANEL  LIPASE, BLOOD  OCCULT BLOOD X 1 CARD TO LAB, STOOL    EKG   Radiology DG Abd 2 Views  Result Date: 02/25/2022 CLINICAL DATA:  Left-sided back pain and abdominal pain. EXAM: ABDOMEN - 2 VIEW COMPARISON:  None Available. FINDINGS: The bowel gas pattern is normal. There is no evidence of free air. Phleboliths are seen in the pelvis. No suspicious calcifications overlying the kidneys. Osseous structures are  within normal limits. IMPRESSION: Negative. Electronically Signed   By: Darliss Cheney M.D.   On: 02/25/2022 16:34     Procedures Procedures (including critical care time)  Medications Ordered in UC Medications - No data to display  Initial Impression / Assessment and Plan / UC Course  I have reviewed the triage vital signs and the nursing notes.  Pertinent labs & imaging results that were available during my care of the patient were reviewed by me and considered in my medical decision making (see chart for details).   Patient is a nontoxic-appearing 31 year old male here for evaluation of three fourth of left-sided back pain with radiation to the left abdomen.  This is associated with some rectal bleeding when having bowel movements.  Patient reports that the bleeding occurs with each bowel movement and it is bright red in nature.  He has had some rectal itching but none at present.  He states that off-and-on over the last year he has had periods of time where 1 entire half of his bowel movement will be bright red blood followed by blood dripping into the toilet.  He denies any sweating, weight changes, or night sweats.  The current pain is a 4/10 at baseline and will occasionally get as high as a 6/10.  It is associate with occasional nausea.  Sitting makes it worse and when he is active he does not notice it as much.  There is no associated urinary complaints or vomiting.  Patient's physical exam reveals a benign cardiopulmonary exam with S1-S2 heart sounds with regular rate and rhythm and lung sounds are clear to auscultation all fields.  No CVA tenderness on exam.  Patient does have tenderness with palpation of the lower left flank.  No spasm or muscle tension noted.  Abdomen soft, flat, with positive bowel sounds in all 4 quadrants.  He does have epigastric, left upper quadrant, and more left lower quadrant tenderness.  Suprapubic and right abdomen is benign.  Digital rectal exam does not reveal any melena stool.  Sample sent for occult blood analysis.  There is no evidence of external hemorrhoids on exam.  I  will check CBC, CMP, lipase, UA, occult blood, and obtain 2 view of the abdomen.  Fecal occult blood is negative.  CBC shows a normal white count of 8.5, RBC count of 5.43, H&H of 15.9 and 45.9, platelet count of 285.  Differential is unremarkable.  Urinalysis is unremarkable.  2 view of the abdomen independent reviewed and evaluated by me.  Impression: Patient has a stool burden on the upper right side.  The left side is unremarkable.  Nonspecific air-fluid levels.  Radiology overread is pending. Radiology impression states the bowel gas pattern is normal.  There is no evidence of free air.  Phleboliths are seen in the pelvis.  No suspicious calcifications overlying the kidneys.  Osseous structures are within normal limits.  CMP is completely unremarkable.  Renal function and transaminases are normal, as are electrolytes.  Lipase is 34.  I discussed the findings of the lab work and x-rays with the patient.  He still has some mild tenderness as before and I do not have a good answer as to why he is tender.  I do feel he needs imaging of his abdomen and he does not have a primary care provider.  We discussed being evaluated in the ER and he is going to go to [facility] regional for further evaluation of his abdominal pain.   Final Clinical Impressions(s) /  UC Diagnoses   Final diagnoses:  Left lower quadrant abdominal pain     Discharge Instructions      As we discussed, your blood work is all very reassuring and so is your abdominal x-ray but I do not have a good idea of why you are having the pain you are having.  I do feel you need a CT scan of your abdomen.  Please go to the emergency department at [facility] for further evaluation and imaging.     ED Prescriptions   None    PDMP not reviewed this encounter.   Margarette Canada, NP 02/25/22 [phone]

## 2022-02-26 ENCOUNTER — Emergency Department
Admission: EM | Admit: 2022-02-26 | Discharge: 2022-02-26 | Disposition: A | Discharge status: Home / Self Care | Payer: Commercial Managed Care - PPO | Attending: Emergency Medicine | Admitting: Emergency Medicine

## 2022-02-26 ENCOUNTER — Emergency Department: Payer: Commercial Managed Care - PPO

## 2022-02-26 ENCOUNTER — Encounter: Payer: Self-pay | Admitting: Emergency Medicine

## 2022-02-26 ENCOUNTER — Other Ambulatory Visit: Payer: Self-pay

## 2022-02-26 DIAGNOSIS — K5900 Constipation, unspecified: Secondary | ICD-10-CM | POA: Insufficient documentation

## 2022-02-26 DIAGNOSIS — R109 Unspecified abdominal pain: Secondary | ICD-10-CM | POA: Diagnosis not present

## 2022-02-26 DIAGNOSIS — R110 Nausea: Secondary | ICD-10-CM | POA: Insufficient documentation

## 2022-02-26 MED ORDER — IOHEXOL 300 MG/ML  SOLN
100.0000 mL | Freq: Once | INTRAMUSCULAR | Status: AC | PRN
  Administered 2022-02-26: 100 mL via INTRAVENOUS

## 2022-02-26 NOTE — ED Triage Notes (Signed)
Pt here for 3 weeks of left abdominal pain mostly to upper abdomen. Denies vomiting or diarrhea. Reports blood in stool occasionally and has hx of hemorrhoids.  Also c/o pain to left flank area. Was seen at [facility] last night night with labs and urine test that were all WNL. Hemoccult at urgent care was negative.  Pt sent for ct scan of abd.

## 2022-02-26 NOTE — ED Provider Notes (Signed)
[facility] Provider Note    Event Date/Time   First MD Initiated Contact with Patient 02/26/22 1016     (approximate)   History   Abdominal Pain   HPI  Alvin Castillo is a 31 y.o. male who has had 3 weeks of constant left-sided abdominal pain radiating into the left lower quadrant.  Does have some intermittent constipation with some intermittent blood in his stool but is not currently experiencing.pt told he had hemorrhoids in the past.  Some associated nausea. No diarrhea. Denies any CP or SOB or other concerns.  Sent from [facility] for CT scan Denies blood now. Denies urinary symptoms concerns for STDs.   Patient was seen yesterday over at urgent care.  Physical Exam   Triage Vital Signs: ED Triage Vitals [02/26/22 0944]  Enc Vitals Group     BP 127/85     Pulse Rate 67     Resp 16     Temp 98.2 F (36.8 C)     Temp Source Oral     SpO2 96 %     Weight 237 lb (107.5 kg)     Height 6' (1.829 m)     Head Circumference      Peak Flow      Pain Score 3     Pain Loc      Pain Edu?      Excl. in [place]?     Most recent vital signs: Vitals:   02/26/22 0944  BP: 127/85  Pulse: 67  Resp: 16  Temp: 98.2 F (36.8 C)  SpO2: 96%     General: Awake, no distress.  CV:  Good peripheral perfusion.  Resp:  Normal effort.  Abd:  No distention. No significant tenderness, rebound or gaurding Other:   Decliend testicle exam given no symptoms in this area/no discharge. Heme occult test negative yesterday   ED Results / Procedures / Treatments   Labs (all labs ordered are listed, but only abnormal results are displayed) Labs Reviewed - No data to display   RADIOLOGY I have reviewed the Ct personally and interpretted and negative for kidney stones/appendicitis   IMPRESSION: 1. No evidence of acute abnormality. 2. Unchanged small umbilical hernia containing fat.   PROCEDURES:  Critical Care performed: No  Procedures   MEDICATIONS ORDERED IN  ED: Medications  iohexol (OMNIPAQUE) 300 MG/ML solution 100 mL (100 mLs Intravenous Contrast Given 02/26/22 1028)     IMPRESSION / MDM / ASSESSMENT AND PLAN / ED COURSE  I reviewed the triage vital signs and the nursing notes.   Patient's presentation is most consistent with acute presentation with potential threat to life or bodily function.   Differential diverticulitis, kidney stone. CT ordered negative except umbilical hernia on exam no obstruction noted.  Given surg referral but unlikely cause of pain. No symptoms of STDS- offered testing and they declined.   Lab reviewed from yesterday normal lipase, CMP, CBC, UA   Discussed keeping food journal to see if linked to dairy/gluten. Given GI for f/u to discuss colonscopy.    The patient is on the cardiac monitor to evaluate for evidence of arrhythmia and/or significant heart rate changes.      FINAL CLINICAL IMPRESSION(S) / ED DIAGNOSES   Final diagnoses:  Abdominal pain, unspecified abdominal location     Rx / DC Orders   ED Discharge Orders     None        Note:  This document was prepared using  [employer] and may include unintentional dictation errors.   Concha Se, MD 02/26/22 1212

## 2022-02-26 NOTE — Discharge Instructions (Addendum)
You can take Tylenol 1 g every 8 hours to help with the pain.  Try to keep a food journal to see if anything could be flaring up your abdominal pain and call the above people to help make follow-up appointments.  Return to the ER for return of blood in the stool fever, worsening pain or any other concerns   IMPRESSION: 1. No evidence of acute abnormality. 2. Unchanged small umbilical hernia containing fat.

## 2022-02-26 NOTE — ED Notes (Signed)
Patient declined discharged vital signs. 

## 2023-01-02 ENCOUNTER — Ambulatory Visit: Payer: Medicaid Other | Admitting: Podiatry

## 2023-01-02 DIAGNOSIS — L600 Ingrowing nail: Secondary | ICD-10-CM | POA: Diagnosis not present

## 2023-01-02 NOTE — Progress Notes (Signed)
   Chief Complaint  Patient presents with   Ingrown Toenail    Right hallux ingrown     Subjective: Patient presents today for evaluation of pain to the medial lateral border right great toe x 7 months. Patient is concerned for possible ingrown nail.  It is very sensitive to touch.  Patient presents today for further treatment and evaluation.  Past Medical History:  Diagnosis Date   Patient denies medical problems     Objective:  General: Well developed, nourished, in no acute distress, alert and oriented x3   Dermatology: Skin is warm, dry and supple bilateral.  Medial and lateral border right great toe is tender with evidence of an ingrowing nail. Pain on palpation noted to the border of the nail fold. The remaining nails appear unremarkable at this time. There are no open sores, lesions.  Vascular: DP and PT pulses palpable.  No clinical evidence of vascular compromise  Neruologic: Grossly intact via light touch bilateral.  Musculoskeletal: No pedal deformity noted  Assesement: #1 Paronychia with ingrowing nail medial and lateral border right great toe  Plan of Care:  1. Patient evaluated.  2. Discussed treatment alternatives and plan of care. Explained nail avulsion procedure and post procedure course to patient. 3. Patient opted for permanent partial nail avulsion of the ingrown portion of the nail.  4. Prior to procedure, local anesthesia infiltration utilized using 3 ml of a 50:50 mixture of 2% plain lidocaine and 0.5% plain marcaine in a normal hallux block fashion and a betadine prep performed.  5. Partial permanent nail avulsion with chemical matrixectomy performed using 3x30sec applications of phenol followed by alcohol flush.  6. Light dressing applied.  Post care instructions provided 7.  Return to clinic as needed  Felecia Shelling, DPM [facility]  Dr. Felecia Shelling, DPM    [address] [place], [place]  [address]                Office [phone]  Fax [phone]

## 2023-01-23 ENCOUNTER — Other Ambulatory Visit: Payer: Self-pay | Admitting: Podiatry

## 2023-01-23 ENCOUNTER — Telehealth: Payer: Self-pay | Admitting: Podiatry

## 2023-01-23 MED ORDER — DOXYCYCLINE HYCLATE 100 MG PO TABS: 100.0000 mg | ORAL_TABLET | Freq: Two times a day (BID) | ORAL | 0 refills | Status: AC

## 2023-01-23 NOTE — Telephone Encounter (Signed)
Pt has a infection and requesting Rx for Antibiotics.    Please advise

## 2023-06-10 ENCOUNTER — Emergency Department: Payer: Medicaid Other

## 2023-06-10 ENCOUNTER — Other Ambulatory Visit: Payer: Self-pay

## 2023-06-10 ENCOUNTER — Emergency Department
Admission: EM | Admit: 2023-06-10 | Discharge: 2023-06-10 | Disposition: A | Discharge status: Home / Self Care | Payer: Medicaid Other | Attending: Emergency Medicine | Admitting: Emergency Medicine

## 2023-06-10 ENCOUNTER — Encounter: Payer: Self-pay | Admitting: Radiology

## 2023-06-10 DIAGNOSIS — R079 Chest pain, unspecified: Secondary | ICD-10-CM | POA: Diagnosis present

## 2023-06-10 DIAGNOSIS — R0789 Other chest pain: Secondary | ICD-10-CM | POA: Diagnosis not present

## 2023-06-10 LAB — BASIC METABOLIC PANEL
Anion gap: 10 (ref 5–15)
BUN: 17 mg/dL (ref 6–20)
CO2: 25 mmol/L (ref 22–32)
Calcium: 8.8 mg/dL — ABNORMAL LOW (ref 8.9–10.3)
Chloride: 104 mmol/L (ref 98–111)
Creatinine, Ser: 1.06 mg/dL (ref 0.61–1.24)
GFR, Estimated: >60 mL/min (ref >60)
Glucose, Bld: 108 mg/dL — ABNORMAL HIGH (ref 70–99)
Potassium: 3.4 mmol/L — ABNORMAL LOW (ref 3.5–5.1)
Sodium: 139 mmol/L (ref 135–145)

## 2023-06-10 LAB — CBC
HCT: 41.7 % (ref 39.0–52.0)
Hemoglobin: 14.5 g/dL (ref 13.0–17.0)
MCH: 29.8 pg (ref 26.0–34.0)
MCHC: 34.8 g/dL (ref 30.0–36.0)
MCV: 85.8 fL (ref 80.0–100.0)
Platelets: 237 10*3/uL (ref 150–400)
RBC: 4.86 MIL/uL (ref 4.22–5.81)
RDW: 13.1 % (ref 11.5–15.5)
WBC: 6.6 10*3/uL (ref 4.0–10.5)
nRBC: 0 % (ref 0.0–0.2)

## 2023-06-10 LAB — TROPONIN I (HIGH SENSITIVITY): Troponin I (High Sensitivity): <2 ng/L (ref <18)

## 2023-06-10 NOTE — ED Provider Notes (Signed)
[facility] Provider Note    Event Date/Time   First MD Initiated Contact with Patient 06/10/23 1843     (approximate)   History   Chest Pain   HPI  Alvin Castillo is a 32 y.o. male with no significant PMH who presents with chest pain over the last 1 to 2 weeks.  The patient states that he initially started having episodes of pain in his left arm.  They start near his armpit and are a sharp shooting pain that radiates down the arm.  This typically will last for a few minutes.  Sometimes associated with his arm pain he will start to get pain in his chest.  The pain in the chest feels like someone is squeezing his chest.  It is sometimes worse with movements or changes in position.  It also lasts for a few minutes and then resolves.  It is not associated with exertion.  The patient has no associated shortness of breath, lightheadedness, cough, or fever.  He has no leg swelling.  He is not having any symptoms currently.  He also reports a few bumps on his left inner arm, legs, and flank which have been present for at least several months.  I reviewed the past medical records.  The patient's most recent outpatient encounter was in July with podiatry.  He was seen in the ED in August 2023 with abdominal pain.   Physical Exam   Triage Vital Signs: ED Triage Vitals  Encounter Vitals Group     BP 06/10/23 1756 119/77     Systolic BP Percentile --      Diastolic BP Percentile --      Pulse Rate 06/10/23 1756 67     Resp 06/10/23 1756 17     Temp 06/10/23 1756 97.6 F (36.4 C)     Temp Source 06/10/23 1756 Oral     SpO2 06/10/23 1756 97 %     Weight 06/10/23 1757 225 lb (102.1 kg)     Height 06/10/23 1757 6' (1.829 m)     Head Circumference --      Peak Flow --      Pain Score --      Pain Loc --      Pain Education --      Exclude from Growth Chart --     Most recent vital signs: Vitals:   06/10/23 1756  BP: 119/77  Pulse: 67  Resp: 17  Temp:  97.6 F (36.4 C)  SpO2: 97%     General: Awake, no distress.  CV:  Good peripheral perfusion.  Normal heart sounds. Resp:  Normal effort.  Lungs CTAB. Abd:  No distention.  Other:  No leg swelling.  Sub-1 cm palpable lymph nodes to left proximal inner arm.   ED Results / Procedures / Treatments   Labs (all labs ordered are listed, but only abnormal results are displayed) Labs Reviewed  BASIC METABOLIC PANEL - Abnormal; Notable for the following components:      Result Value   Potassium 3.4 (*)    Glucose, Bld 108 (*)    Calcium 8.8 (*)    All other components within normal limits  CBC  TROPONIN I (HIGH SENSITIVITY)     EKG  ED ECG REPORT I, Dionne Bucy, the attending physician, personally viewed and interpreted this ECG.  Date: 06/10/2023 EKG Time: 1751 Rate: 72 Rhythm: normal sinus rhythm QRS Axis: normal Intervals: normal ST/T Wave abnormalities: normal Narrative  Interpretation: no evidence of acute ischemia    RADIOLOGY  Chest x-ray: I independently viewed and interpreted the images; there is no focal consolidation or edema   PROCEDURES:  Critical Care performed: No  Procedures   MEDICATIONS ORDERED IN ED: Medications - No data to display   IMPRESSION / MDM / ASSESSMENT AND PLAN / ED COURSE  I reviewed the triage vital signs and the nursing notes.  32 year old male with no significant past medical history presents with atypical intermittent and nonexertional episodes of chest pain over the last few weeks, sometimes associated with shooting left arm pains.  There are no concerning associated symptoms.  On exam the patient is well-appearing with normal vital signs.  EKG is nonischemic.  Chest x-ray shows no acute findings.  BMP and CBC are within normal limits.  Troponin is negative.  Differential diagnosis includes, but is not limited to, costochondritis, other musculoskeletal pain, peripheral neuropathy, GERD, other benign etiology.  There  is no clinical evidence for ACS.  Given the duration of the symptoms there is no indication for serial cardiac enzymes.  The presentation is also not consistent with PE, and the patient is PERC negative.  There is no evidence of aortic dissection or other vascular cause.  The patient does have a few small, nontender and mobile lymph nodes.  These have been present for months.  There is no indication for further ED workup for this.  Patient's presentation is most consistent with acute complicated illness / injury requiring diagnostic workup.  At this time, based on the negative workup, the patient is stable for discharge home.  I counseled him on the results of the workup, the likely etiologies of his symptoms, and on the follow-up plan.  I recommended that he follow-up with primary care.  He states he is working on obtaining one.  I gave strict return precautions and he expressed understanding.   FINAL CLINICAL IMPRESSION(S) / ED DIAGNOSES   Final diagnoses:  Atypical chest pain     Rx / DC Orders   ED Discharge Orders     None        Note:  This document was prepared using Dragon voice recognition software and may include unintentional dictation errors.    Dionne Bucy, MD 06/10/23 1925

## 2023-06-10 NOTE — Discharge Instructions (Signed)
Follow-up with her primary care provider.  In the meantime, return to the ER for new, worsening, or persistent severe chest pain, pain that lasts for more than a few minutes and is not getting better, pain related to exertion, pain associated with difficulty breathing, lightheadedness, or feeling weak or going to pass out, weakness or numbness in the arm, or any other new or worsening symptoms that concern you.

## 2023-06-10 NOTE — ED Triage Notes (Signed)
Pt states for the past week he has had chest pain and left arms pain and numbness. Pt states he has a family history of angina but he has never been diagnosed with it. Pt states he also has some bumps on his stomach, left arm and thigh that he wants checked out.
